# Patient Record
Sex: Male | Born: 1960 | Race: White | Hispanic: No | Smoking: Current every day smoker
Health system: Southern US, Community
[De-identification: ages and names within clinical notes are randomized; demographics above are authoritative.]

## PROBLEM LIST (undated history)

## (undated) ENCOUNTER — Emergency Department (HOSPITAL_COMMUNITY): Discharge status: Absent without leave | Payer: Self-pay

## (undated) DIAGNOSIS — E119 Type 2 diabetes mellitus without complications: Secondary | ICD-10-CM

## (undated) DIAGNOSIS — I10 Essential (primary) hypertension: Secondary | ICD-10-CM

---

## 2006-01-30 ENCOUNTER — Ambulatory Visit: Payer: Self-pay

## 2006-03-12 ENCOUNTER — Other Ambulatory Visit: Payer: Self-pay

## 2006-03-19 ENCOUNTER — Ambulatory Visit: Payer: Self-pay | Admitting: Specialist

## 2010-05-22 ENCOUNTER — Emergency Department: Payer: Self-pay | Admitting: Unknown Physician Specialty

## 2010-05-24 DIAGNOSIS — S129XXA Fracture of neck, unspecified, initial encounter: Secondary | ICD-10-CM | POA: Insufficient documentation

## 2010-06-28 DIAGNOSIS — I10 Essential (primary) hypertension: Secondary | ICD-10-CM | POA: Insufficient documentation

## 2010-09-05 DIAGNOSIS — M67919 Unspecified disorder of synovium and tendon, unspecified shoulder: Secondary | ICD-10-CM | POA: Insufficient documentation

## 2010-11-07 DIAGNOSIS — M25519 Pain in unspecified shoulder: Secondary | ICD-10-CM | POA: Insufficient documentation

## 2011-08-06 DIAGNOSIS — M8448XA Pathological fracture, other site, initial encounter for fracture: Secondary | ICD-10-CM | POA: Insufficient documentation

## 2011-08-06 DIAGNOSIS — M51369 Other intervertebral disc degeneration, lumbar region without mention of lumbar back pain or lower extremity pain: Secondary | ICD-10-CM | POA: Insufficient documentation

## 2011-12-03 DIAGNOSIS — G894 Chronic pain syndrome: Secondary | ICD-10-CM | POA: Insufficient documentation

## 2015-12-29 ENCOUNTER — Emergency Department (HOSPITAL_COMMUNITY)
Admission: EM | Admit: 2015-12-29 | Discharge: 2015-12-30 | Discharge status: Against Medical Advice | Payer: Self-pay | Attending: Emergency Medicine | Admitting: Emergency Medicine

## 2015-12-29 ENCOUNTER — Emergency Department (HOSPITAL_COMMUNITY): Payer: Self-pay

## 2015-12-29 ENCOUNTER — Encounter (HOSPITAL_COMMUNITY): Payer: Self-pay | Admitting: Emergency Medicine

## 2015-12-29 DIAGNOSIS — S50311A Abrasion of right elbow, initial encounter: Secondary | ICD-10-CM | POA: Insufficient documentation

## 2015-12-29 DIAGNOSIS — Y999 Unspecified external cause status: Secondary | ICD-10-CM | POA: Insufficient documentation

## 2015-12-29 DIAGNOSIS — F10920 Alcohol use, unspecified with intoxication, uncomplicated: Secondary | ICD-10-CM

## 2015-12-29 DIAGNOSIS — I10 Essential (primary) hypertension: Secondary | ICD-10-CM | POA: Insufficient documentation

## 2015-12-29 DIAGNOSIS — Z5181 Encounter for therapeutic drug level monitoring: Secondary | ICD-10-CM | POA: Insufficient documentation

## 2015-12-29 DIAGNOSIS — Y929 Unspecified place or not applicable: Secondary | ICD-10-CM | POA: Insufficient documentation

## 2015-12-29 DIAGNOSIS — S0031XA Abrasion of nose, initial encounter: Secondary | ICD-10-CM | POA: Insufficient documentation

## 2015-12-29 DIAGNOSIS — W010XXA Fall on same level from slipping, tripping and stumbling without subsequent striking against object, initial encounter: Secondary | ICD-10-CM | POA: Insufficient documentation

## 2015-12-29 DIAGNOSIS — Y939 Activity, unspecified: Secondary | ICD-10-CM | POA: Insufficient documentation

## 2015-12-29 DIAGNOSIS — T148XXA Other injury of unspecified body region, initial encounter: Secondary | ICD-10-CM

## 2015-12-29 DIAGNOSIS — S40212A Abrasion of left shoulder, initial encounter: Secondary | ICD-10-CM | POA: Insufficient documentation

## 2015-12-29 DIAGNOSIS — F10120 Alcohol abuse with intoxication, uncomplicated: Secondary | ICD-10-CM | POA: Insufficient documentation

## 2015-12-29 DIAGNOSIS — F17200 Nicotine dependence, unspecified, uncomplicated: Secondary | ICD-10-CM | POA: Insufficient documentation

## 2015-12-29 DIAGNOSIS — E119 Type 2 diabetes mellitus without complications: Secondary | ICD-10-CM | POA: Insufficient documentation

## 2015-12-29 HISTORY — DX: Essential (primary) hypertension: I10

## 2015-12-29 HISTORY — DX: Type 2 diabetes mellitus without complications: E11.9

## 2015-12-29 MED ORDER — TETANUS-DIPHTH-ACELL PERTUSSIS 5-2.5-18.5 LF-MCG/0.5 IM SUSP
0.5000 mL | Freq: Once | INTRAMUSCULAR | Status: DC
  Filled 2015-12-29: qty 0.5

## 2015-12-29 NOTE — ED Notes (Signed)
Pt has multiple scratches on his R elbow, bilateral knees, nose and L cheek.

## 2015-12-29 NOTE — ED Notes (Signed)
[place] EMS[place], pt c/o intoxication and SI. Pt uses alcohol to help with his depression. Pt sts he has had 2 1/2 40 oz bottles. A&Ox4, unsteady on his feet.

## 2015-12-29 NOTE — ED Notes (Signed)
Bed: [facility] Expected date:  Expected time:  Means of arrival:  Comments: No bed 

## 2015-12-29 NOTE — ED Provider Notes (Signed)
CSN: [id number]     Arrival date & time 12/29/15  2255 History  By signing my name below, I, Vista Minkobert Ross, attest that this documentation has been prepared under the direction and in the presence of Gilda Creasehristopher J Pollina, MD. Electronically signed, Vista Minkobert Ross, ED Scribe. 12/29/2015. 11:52 PM.    Chief Complaint  Patient presents with  . Alcohol Intoxication  . Suicidal   The history is provided by the patient and the EMS personnel. No language interpreter was used.   HPI Comments: Robyn HaberDavid W Owen is a 55 y.o. male brought in by ambulance, who presents to the Emergency Department for alcohol intoxication less than one hour ago. Pt is alert and oriented currently. Per EMS, pt stated he had SI but currently denies ever saying that. Pt has abrasions on his right elbow, nose left cheek, and left shoulder. Pt reports he was unaware of his injuries but later states that he may have slipped somewhere. Pt states he lives in [place] with his mother. Pt denies any SI/HI.  Past Medical History  Diagnosis Date  . Diabetes mellitus without complication (HCC)   . Hypertension    No past surgical history on file. No family history on file. Social History  Substance Use Topics  . Smoking status: Not on file  . Smokeless tobacco: Not on file  . Alcohol Use: Yes    Review of Systems  Skin: Positive for wound (right elbow, left knee, nose, left cheek, left shoulder).  Psychiatric/Behavioral: Negative for suicidal ideas.  All other systems reviewed and are negative.   Allergies  Review of patient's allergies indicates no known allergies.  Home Medications   Prior to Admission medications   Not on File   BP 151/111 mmHg  Pulse 88  Temp(Src) 99 F (37.2 C) (Oral)  Resp 20  SpO2 95% Physical Exam  Constitutional: He is oriented to person, place, and time. He appears well-developed and well-nourished. No distress.  HENT:  Head: Normocephalic and atraumatic.  Right Ear: Hearing normal.   Left Ear: Hearing normal.  Nose: Nose normal.  Mouth/Throat: Oropharynx is clear and moist and mucous membranes are normal.  Eyes: Conjunctivae and EOM are normal. Pupils are equal, round, and reactive to light.  Neck: Normal range of motion. Neck supple.  Cardiovascular: Regular rhythm, S1 normal and S2 normal.  Exam reveals no gallop and no friction rub.   No murmur heard. Pulmonary/Chest: Effort normal and breath sounds normal. No respiratory distress. He exhibits no tenderness.  Abdominal: Soft. Normal appearance and bowel sounds are normal. There is no hepatosplenomegaly. There is no tenderness. There is no rebound, no guarding, no tenderness at McBurney's point and negative Murphy's sign. No hernia.  Musculoskeletal: Normal range of motion.  Swelling and abrasion with no deformity.   Neurological: He is alert and oriented to person, place, and time. He has normal strength. No cranial nerve deficit or sensory deficit. Coordination normal. GCS eye subscore is 4. GCS verbal subscore is 5. GCS motor subscore is 6.  Skin: Skin is warm, dry and intact. No rash noted. No cyanosis.  Psychiatric: Thought content normal. His speech is slurred.  Nursing note and vitals reviewed.   ED Course  Procedures  DIAGNOSTIC STUDIES: Oxygen Saturation is 95% on RA, normal by my interpretation.  COORDINATION OF CARE: 11:16 PM-Will be further evaluated for any injuries. Discussed treatment plan with pt at bedside and pt agreed to plan.   Labs Review Labs Reviewed - No data to display  Imaging Review No results found. I have personally reviewed and evaluated these images and lab results as part of my medical decision-making.   EKG Interpretation None      MDM   Final diagnoses:  None  Alcohol intoxication Abrasion  Patient presents to the emergency department for evaluation after possible fall. Patient is actively intoxicated. He does have a history of alcohol abuse. Patient is unsure how  he received his abrasions, thinks he might have tripped. He is without complaints. He reportedly mention to staff earlier that he was suicidal, however, he adamantly denied it to me on multiple occasions. Workup was initiated to rule out significant injury from his fall, although he is alert and oriented without any clinical signs of significant injury, he was intoxicated. Patient did, however, elope from the ER during workup.  I personally performed the services described in this documentation, which was scribed in my presence. The recorded information has been reviewed and is accurate.      Gilda Creasehristopher J Pollina, MD 12/30/15 [place] [email or web address]

## 2015-12-30 ENCOUNTER — Emergency Department (HOSPITAL_COMMUNITY): Payer: Self-pay

## 2015-12-30 ENCOUNTER — Ambulatory Visit (HOSPITAL_COMMUNITY): Admission: RE | Admit: 2015-12-30 | Payer: Self-pay | Source: Ambulatory Visit

## 2015-12-30 ENCOUNTER — Ambulatory Visit (HOSPITAL_COMMUNITY): Payer: Self-pay

## 2015-12-30 ENCOUNTER — Inpatient Hospital Stay (HOSPITAL_COMMUNITY)
Admission: EM | Admit: 2015-12-30 | Discharge: 2015-12-31 | DRG: 087 | Disposition: A | Discharge status: Home / Self Care | Payer: Self-pay | Attending: Surgery | Admitting: Surgery

## 2015-12-30 ENCOUNTER — Encounter (HOSPITAL_COMMUNITY): Payer: Self-pay

## 2015-12-30 DIAGNOSIS — G4733 Obstructive sleep apnea (adult) (pediatric): Secondary | ICD-10-CM | POA: Diagnosis present

## 2015-12-30 DIAGNOSIS — I609 Nontraumatic subarachnoid hemorrhage, unspecified: Secondary | ICD-10-CM

## 2015-12-30 DIAGNOSIS — S0285XA Fracture of orbit, unspecified, initial encounter for closed fracture: Secondary | ICD-10-CM

## 2015-12-30 DIAGNOSIS — T148XXA Other injury of unspecified body region, initial encounter: Secondary | ICD-10-CM

## 2015-12-30 DIAGNOSIS — S066X0A Traumatic subarachnoid hemorrhage without loss of consciousness, initial encounter: Principal | ICD-10-CM | POA: Diagnosis present

## 2015-12-30 DIAGNOSIS — I10 Essential (primary) hypertension: Secondary | ICD-10-CM | POA: Diagnosis present

## 2015-12-30 DIAGNOSIS — E041 Nontoxic single thyroid nodule: Secondary | ICD-10-CM | POA: Diagnosis present

## 2015-12-30 DIAGNOSIS — W138XXA Fall from, out of or through other building or structure, initial encounter: Secondary | ICD-10-CM | POA: Diagnosis present

## 2015-12-30 DIAGNOSIS — Y9302 Activity, running: Secondary | ICD-10-CM | POA: Diagnosis present

## 2015-12-30 DIAGNOSIS — Y92481 Parking lot as the place of occurrence of the external cause: Secondary | ICD-10-CM

## 2015-12-30 DIAGNOSIS — S0231XA Fracture of orbital floor, right side, initial encounter for closed fracture: Secondary | ICD-10-CM | POA: Diagnosis present

## 2015-12-30 DIAGNOSIS — F17200 Nicotine dependence, unspecified, uncomplicated: Secondary | ICD-10-CM | POA: Diagnosis present

## 2015-12-30 DIAGNOSIS — F10129 Alcohol abuse with intoxication, unspecified: Secondary | ICD-10-CM | POA: Diagnosis present

## 2015-12-30 DIAGNOSIS — S022XXB Fracture of nasal bones, initial encounter for open fracture: Secondary | ICD-10-CM | POA: Diagnosis present

## 2015-12-30 DIAGNOSIS — S80212A Abrasion, left knee, initial encounter: Secondary | ICD-10-CM | POA: Diagnosis present

## 2015-12-30 DIAGNOSIS — S301XXA Contusion of abdominal wall, initial encounter: Secondary | ICD-10-CM

## 2015-12-30 DIAGNOSIS — E119 Type 2 diabetes mellitus without complications: Secondary | ICD-10-CM | POA: Diagnosis present

## 2015-12-30 DIAGNOSIS — Z23 Encounter for immunization: Secondary | ICD-10-CM

## 2015-12-30 LAB — URINALYSIS, ROUTINE W REFLEX MICROSCOPIC
BILIRUBIN URINE: NEGATIVE
Glucose, UA: 500 mg/dL — AB
Ketones, ur: NEGATIVE mg/dL
Leukocytes, UA: NEGATIVE
Nitrite: NEGATIVE
PH: 5 (ref 5.0–8.0)
Protein, ur: 30 mg/dL — AB
SPECIFIC GRAVITY, URINE: 1.008 (ref 1.005–1.030)

## 2015-12-30 LAB — GLUCOSE, CAPILLARY
Glucose-Capillary: 132 mg/dL — ABNORMAL HIGH (ref 65–99)
Glucose-Capillary: 118 mg/dL — ABNORMAL HIGH (ref 65–99)

## 2015-12-30 LAB — CBC WITH DIFFERENTIAL/PLATELET
BASOS PCT: 1 %
Basophils Absolute: 0.1 10*3/uL (ref 0.0–0.1)
EOS ABS: 0 10*3/uL (ref 0.0–0.7)
Eosinophils Relative: 0 %
HCT: 40 % (ref 39.0–52.0)
HEMOGLOBIN: 13.8 g/dL (ref 13.0–17.0)
Lymphocytes Relative: 14 %
Lymphs Abs: 2.2 10*3/uL (ref 0.7–4.0)
MCH: 31.8 pg (ref 26.0–34.0)
MCHC: 34.5 g/dL (ref 30.0–36.0)
MCV: 92.2 fL (ref 78.0–100.0)
MONO ABS: 1.6 10*3/uL — AB (ref 0.1–1.0)
MONOS PCT: 10 %
NEUTROS PCT: 75 %
Neutro Abs: 12.5 10*3/uL — ABNORMAL HIGH (ref 1.7–7.7)
PLATELETS: 273 10*3/uL (ref 150–400)
RBC: 4.34 MIL/uL (ref 4.22–5.81)
RDW: 12.7 % (ref 11.5–15.5)
WBC: 16.5 10*3/uL — ABNORMAL HIGH (ref 4.0–10.5)

## 2015-12-30 LAB — CBC
HCT: 46 % (ref 39.0–52.0)
HEMATOCRIT: 38.9 % — AB (ref 39.0–52.0)
Hemoglobin: 13.7 g/dL (ref 13.0–17.0)
Hemoglobin: 15.9 g/dL (ref 13.0–17.0)
MCH: 31.7 pg (ref 26.0–34.0)
MCH: 31.9 pg (ref 26.0–34.0)
MCHC: 34.6 g/dL (ref 30.0–36.0)
MCHC: 35.2 g/dL (ref 30.0–36.0)
MCV: 90.5 fL (ref 78.0–100.0)
MCV: 91.8 fL (ref 78.0–100.0)
PLATELETS: 246 10*3/uL (ref 150–400)
PLATELETS: 298 10*3/uL (ref 150–400)
RBC: 4.3 MIL/uL (ref 4.22–5.81)
RBC: 5.01 MIL/uL (ref 4.22–5.81)
RDW: 12.9 % (ref 11.5–15.5)
RDW: 12.9 % (ref 11.5–15.5)
WBC: 14.6 10*3/uL — AB (ref 4.0–10.5)
WBC: 16.1 10*3/uL — AB (ref 4.0–10.5)

## 2015-12-30 LAB — RAPID URINE DRUG SCREEN, HOSP PERFORMED
AMPHETAMINES: NOT DETECTED
AMPHETAMINES: NOT DETECTED
BENZODIAZEPINES: NOT DETECTED
Barbiturates: NOT DETECTED
Barbiturates: NOT DETECTED
Benzodiazepines: NOT DETECTED
Cocaine: NOT DETECTED
Cocaine: NOT DETECTED
OPIATES: NOT DETECTED
Opiates: NOT DETECTED
TETRAHYDROCANNABINOL: NOT DETECTED
TETRAHYDROCANNABINOL: NOT DETECTED

## 2015-12-30 LAB — COMPREHENSIVE METABOLIC PANEL
ALBUMIN: 3.7 g/dL (ref 3.5–5.0)
ALBUMIN: 4.5 g/dL (ref 3.5–5.0)
ALT: 32 U/L (ref 17–63)
ALT: 34 U/L (ref 17–63)
ANION GAP: 11 (ref 5–15)
AST: 53 U/L — AB (ref 15–41)
AST: 75 U/L — ABNORMAL HIGH (ref 15–41)
Alkaline Phosphatase: 67 U/L (ref 38–126)
Alkaline Phosphatase: 73 U/L (ref 38–126)
Anion gap: 10 (ref 5–15)
BUN: 7 mg/dL (ref 6–20)
BUN: 8 mg/dL (ref 6–20)
CHLORIDE: 100 mmol/L — AB (ref 101–111)
CHLORIDE: 100 mmol/L — AB (ref 101–111)
CO2: 19 mmol/L — AB (ref 22–32)
CO2: 21 mmol/L — AB (ref 22–32)
CREATININE: 0.66 mg/dL (ref 0.61–1.24)
CREATININE: 0.71 mg/dL (ref 0.61–1.24)
Calcium: 8.9 mg/dL (ref 8.9–10.3)
Calcium: 9.8 mg/dL (ref 8.9–10.3)
GFR calc Af Amer: >60 mL/min (ref >60)
GFR calc non Af Amer: >60 mL/min (ref >60)
GFR calc non Af Amer: >60 mL/min (ref >60)
GLUCOSE: 135 mg/dL — AB (ref 65–99)
Glucose, Bld: 154 mg/dL — ABNORMAL HIGH (ref 65–99)
POTASSIUM: 3.2 mmol/L — AB (ref 3.5–5.1)
Potassium: 3.8 mmol/L (ref 3.5–5.1)
SODIUM: 130 mmol/L — AB (ref 135–145)
SODIUM: 131 mmol/L — AB (ref 135–145)
Total Bilirubin: 0.8 mg/dL (ref 0.3–1.2)
Total Bilirubin: 0.8 mg/dL (ref 0.3–1.2)
Total Protein: 6.3 g/dL — ABNORMAL LOW (ref 6.5–8.1)
Total Protein: 7.6 g/dL (ref 6.5–8.1)

## 2015-12-30 LAB — URINE MICROSCOPIC-ADD ON
Bacteria, UA: NONE SEEN
RBC / HPF: NONE SEEN RBC/hpf (ref 0–5)
SQUAMOUS EPITHELIAL / LPF: NONE SEEN
WBC, UA: NONE SEEN WBC/hpf (ref 0–5)

## 2015-12-30 LAB — BASIC METABOLIC PANEL
ANION GAP: 10 (ref 5–15)
BUN: 9 mg/dL (ref 6–20)
CALCIUM: 8.7 mg/dL — AB (ref 8.9–10.3)
CO2: 20 mmol/L — AB (ref 22–32)
Chloride: 101 mmol/L (ref 101–111)
Creatinine, Ser: 0.71 mg/dL (ref 0.61–1.24)
GLUCOSE: 155 mg/dL — AB (ref 65–99)
POTASSIUM: 4 mmol/L (ref 3.5–5.1)
Sodium: 131 mmol/L — ABNORMAL LOW (ref 135–145)

## 2015-12-30 LAB — PROTIME-INR
INR: 1.16 (ref 0.00–1.49)
Prothrombin Time: 15 seconds (ref 11.6–15.2)

## 2015-12-30 LAB — ETHANOL: Alcohol, Ethyl (B): 301 mg/dL (ref <5)

## 2015-12-30 LAB — CBG MONITORING, ED: Glucose-Capillary: 150 mg/dL — ABNORMAL HIGH (ref 65–99)

## 2015-12-30 MED ORDER — HALOPERIDOL LACTATE 5 MG/ML IJ SOLN
5.0000 mg | Freq: Once | INTRAMUSCULAR | Status: AC
  Administered 2015-12-30: 5 mg via INTRAVENOUS
  Filled 2015-12-30: qty 1

## 2015-12-30 MED ORDER — HALOPERIDOL LACTATE 5 MG/ML IJ SOLN
5.0000 mg | Freq: Once | INTRAMUSCULAR | Status: AC
Start: 2015-12-30 — End: 2015-12-30
  Administered 2015-12-30: 5 mg via INTRAVENOUS
  Filled 2015-12-30: qty 1

## 2015-12-30 MED ORDER — VITAMIN B-1 100 MG PO TABS
100.0000 mg | ORAL_TABLET | Freq: Every day | ORAL | Status: DC
  Administered 2015-12-31: 100 mg via ORAL
  Filled 2015-12-30: qty 1

## 2015-12-30 MED ORDER — LORAZEPAM 1 MG PO TABS: 0.0000 mg | ORAL_TABLET | Freq: Four times a day (QID) | ORAL | Status: DC

## 2015-12-30 MED ORDER — MORPHINE SULFATE (PF) 2 MG/ML IV SOLN: 1.0000 mg | INTRAVENOUS | Status: DC | PRN

## 2015-12-30 MED ORDER — KCL IN DEXTROSE-NACL 20-5-0.45 MEQ/L-%-% IV SOLN
INTRAVENOUS | Status: DC
  Administered 2015-12-30 – 2015-12-31 (×2): via INTRAVENOUS
  Filled 2015-12-30 (×4): qty 1000

## 2015-12-30 MED ORDER — LORAZEPAM 1 MG PO TABS: 0.0000 mg | ORAL_TABLET | Freq: Two times a day (BID) | ORAL | Status: DC

## 2015-12-30 MED ORDER — LORAZEPAM 2 MG/ML IJ SOLN: 1.0000 mg | Freq: Four times a day (QID) | INTRAMUSCULAR | Status: DC | PRN

## 2015-12-30 MED ORDER — FAMOTIDINE 20 MG PO TABS
40.0000 mg | ORAL_TABLET | Freq: Every day | ORAL | Status: DC
  Administered 2015-12-31: 40 mg via ORAL
  Filled 2015-12-30: qty 2

## 2015-12-30 MED ORDER — ADULT MULTIVITAMIN W/MINERALS CH
1.0000 | ORAL_TABLET | Freq: Every day | ORAL | Status: DC
  Administered 2015-12-30 – 2015-12-31 (×2): 1 via ORAL
  Filled 2015-12-30 (×2): qty 1

## 2015-12-30 MED ORDER — IOPAMIDOL (ISOVUE-300) INJECTION 61%
100.0000 mL | Freq: Once | INTRAVENOUS | Status: AC | PRN
  Administered 2015-12-30: 100 mL via INTRAVENOUS

## 2015-12-30 MED ORDER — ONDANSETRON HCL 4 MG/2ML IJ SOLN: 4.0000 mg | INTRAMUSCULAR | Status: DC | PRN

## 2015-12-30 MED ORDER — FAMOTIDINE IN NACL 20-0.9 MG/50ML-% IV SOLN
20.0000 mg | Freq: Two times a day (BID) | INTRAVENOUS | Status: DC
  Administered 2015-12-30: 20 mg via INTRAVENOUS
  Filled 2015-12-30: qty 50

## 2015-12-30 MED ORDER — CEFAZOLIN IN D5W 1 GM/50ML IV SOLN
1.0000 g | Freq: Three times a day (TID) | INTRAVENOUS | Status: DC
  Administered 2015-12-30 – 2015-12-31 (×4): 1 g via INTRAVENOUS
  Filled 2015-12-30 (×7): qty 50

## 2015-12-30 MED ORDER — PANTOPRAZOLE SODIUM 40 MG PO TBEC
40.0000 mg | DELAYED_RELEASE_TABLET | Freq: Two times a day (BID) | ORAL | Status: DC
  Administered 2015-12-30 – 2015-12-31 (×2): 40 mg via ORAL
  Filled 2015-12-30 (×2): qty 1

## 2015-12-30 MED ORDER — ONDANSETRON HCL 4 MG PO TABS: 4.0000 mg | ORAL_TABLET | Freq: Four times a day (QID) | ORAL | Status: DC | PRN

## 2015-12-30 MED ORDER — LIDOCAINE HCL (PF) 1 % IJ SOLN
30.0000 mL | Freq: Once | INTRAMUSCULAR | Status: AC
  Administered 2015-12-30: 30 mL
  Filled 2015-12-30: qty 30

## 2015-12-30 MED ORDER — FOLIC ACID 1 MG PO TABS
1.0000 mg | ORAL_TABLET | Freq: Every day | ORAL | Status: DC
Start: 2015-12-30 — End: 2015-12-31
  Administered 2015-12-30 – 2015-12-31 (×2): 1 mg via ORAL
  Filled 2015-12-30 (×2): qty 1

## 2015-12-30 MED ORDER — LORAZEPAM 1 MG PO TABS: 1.0000 mg | ORAL_TABLET | Freq: Four times a day (QID) | ORAL | Status: DC | PRN

## 2015-12-30 MED ORDER — CEFAZOLIN IN D5W 1 GM/50ML IV SOLN
1.0000 g | Freq: Once | INTRAVENOUS | Status: AC
  Administered 2015-12-30: 1 g via INTRAVENOUS
  Filled 2015-12-30: qty 50

## 2015-12-30 MED ORDER — TETANUS-DIPHTH-ACELL PERTUSSIS 5-2.5-18.5 LF-MCG/0.5 IM SUSP
0.5000 mL | Freq: Once | INTRAMUSCULAR | Status: AC
  Administered 2015-12-30: 0.5 mL via INTRAMUSCULAR
  Filled 2015-12-30: qty 0.5

## 2015-12-30 MED ORDER — BACITRACIN-NEOMYCIN-POLYMYXIN OINTMENT TUBE
TOPICAL_OINTMENT | Freq: Every day | CUTANEOUS | Status: DC
  Administered 2015-12-30: 1 via TOPICAL
  Administered 2015-12-31: 09:00:00 via TOPICAL
  Filled 2015-12-30: qty 15

## 2015-12-30 MED ORDER — THIAMINE HCL 100 MG/ML IJ SOLN
100.0000 mg | Freq: Every day | INTRAMUSCULAR | Status: DC
  Administered 2015-12-30: 100 mg via INTRAVENOUS
  Filled 2015-12-30: qty 2

## 2015-12-30 MED ORDER — DOCUSATE SODIUM 100 MG PO CAPS: 100.0000 mg | ORAL_CAPSULE | Freq: Every day | ORAL | Status: DC
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
## 2015-12-30 NOTE — ED Notes (Signed)
Nose continues to bleed, Combat Gauze now applied
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
## 2015-12-30 NOTE — Progress Notes (Signed)
Patient is awake and alert.  Not showing signs of withdrawal.  Wants to eat.  Head CT minimal SAH.  Will transfer to the floor.  Marta LamasJames O. Gae BonWyatt, III, MD, FACS [phone] Trauma Surgeon
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
## 2015-12-30 NOTE — ED Provider Notes (Addendum)
CSN: [id number]     Arrival date & time 12/30/15  0112 History  By signing my name below, I, Jeffrey Solomon, attest that this documentation has been prepared under the direction and in the presence of Jeffrey Crease, MD. Electronically signed, Jeffrey Solomon, ED Scribe. 12/30/2015. 1:37 AM.   Chief Complaint  Patient presents with  . Facial Injury   The history is provided by the patient and the EMS personnel. No language interpreter was used.   HPI Comments: Jeffrey Solomon is a 55 y.o. male brought in by ambulance, who presents to the Emergency Department for a facial injury s/p an incident that occurred less 30 minutes ago. Pt was seen in the [facility] ED less than one hour ago for alcohol intoxication. Pt left ED AMA, exiting into the parking lot where he tried to jump a concrete wall and fell. Pt busted his nose, bleeding is controlled.    Past Medical History  Diagnosis Date  . Diabetes mellitus without complication (HCC)   . Hypertension    History reviewed. No pertinent past surgical history. History reviewed. No pertinent family history. Social History  Substance Use Topics  . Smoking status: Current Every Day Smoker  . Smokeless tobacco: None  . Alcohol Use: Yes    Review of Systems  Constitutional: Negative for fever.  Skin: Positive for wound (nose).      Allergies  Review of patient's allergies indicates no known allergies.  Home Medications   Prior to Admission medications   Not on File   There were no vitals taken for this visit. Physical Exam  Constitutional: He is oriented to person, place, and time. He appears well-developed and well-nourished. No distress.  HENT:  Head: Normocephalic.  Right Ear: Hearing normal.  Left Ear: Hearing normal.  Nose: Nose normal.  Mouth/Throat: Oropharynx is clear and moist and mucous membranes are normal.  Linear laceration along entire length of nose with exposed nasal passage.  Abrasion and swelling left zygomatic region   Eyes: Conjunctivae and EOM are normal. Pupils are equal, round, and reactive to light.  Neck: Normal range of motion. Neck supple.  Cardiovascular: Regular rhythm, S1 normal and S2 normal.  Exam reveals no gallop and no friction rub.   No murmur heard. Pulmonary/Chest: Effort normal and breath sounds normal. No respiratory distress. He exhibits no tenderness.  Abdominal: Soft. Normal appearance and bowel sounds are normal. There is no hepatosplenomegaly. There is no tenderness. There is no rebound, no guarding, no tenderness at McBurney's point and negative Murphy's sign. No hernia.  Musculoskeletal: Normal range of motion.  Neurological: He is alert and oriented to person, place, and time. He has normal strength. No cranial nerve deficit or sensory deficit. Coordination normal. GCS eye subscore is 4. GCS verbal subscore is 5. GCS motor subscore is 6.  Skin: Skin is warm, dry and intact. No rash noted. No cyanosis.  Abrasions to left face, left shoulder, leftt knee, right elbow  Psychiatric: He has a normal mood and affect. His speech is normal and behavior is normal. Thought content normal.  Nursing note and vitals reviewed.        ED Course  Procedures   LACERATION REPAIR Performed by: Jeffrey Solomon. Authorized by: Jeffrey Solomon Consent: Verbal consent obtained. Risks and benefits: risks, benefits and alternatives were discussed Consent given by: patient Patient identity confirmed: provided demographic data Prepped and Draped in normal sterile fashion Wound explored  Laceration Location: face Laceration Length: 5cm Several pieces  of vegetation in wound, removed with forceps and extensive irrigation - no residual seen Anesthesia: local infiltration Local anesthetic: lidocaine 1% without epinephrine Anesthetic total: 4 ml Irrigation method: syringe Amount of cleaning: extensive Skin closure: sutures Number of sutures: 10 Technique: simple interrupted,  5-0 Prolene  Patient tolerance: Patient tolerated the procedure well with no immediate complications.   DIAGNOSTIC STUDIES: Oxygen Saturation is 95% on RA, normal by my interpretation.  COORDINATION OF CARE: 1:30 AM-Will order blood work and imaging. Discussed treatment plan with pt at bedside and pt agreed to plan.   Labs Review Labs Reviewed  CBC WITH DIFFERENTIAL/PLATELET - Abnormal; Notable for the following:    WBC 16.5 (*)    Neutro Abs 12.5 (*)    Monocytes Absolute 1.6 (*)    All other components within normal limits  COMPREHENSIVE METABOLIC PANEL - Abnormal; Notable for the following:    Sodium 130 (*)    Potassium 3.2 (*)    Chloride 100 (*)    CO2 19 (*)    Glucose, Bld 135 (*)    Total Protein 6.3 (*)    AST 75 (*)    All other components within normal limits  URINALYSIS, ROUTINE W REFLEX MICROSCOPIC (NOT AT [facility]) - Abnormal; Notable for the following:    Glucose, UA 500 (*)    Hgb urine dipstick LARGE (*)    Protein, ur 30 (*)    All other components within normal limits  PROTIME-INR  URINE RAPID DRUG SCREEN, HOSP PERFORMED  URINE MICROSCOPIC-ADD ON    Imaging Review Ct Head Wo Contrast  12/30/2015  CLINICAL DATA:  Injuries to left head and left shoulder of unknown etiology, and subsequently ran out of ER, went over a concrete wall and fell onto ground. Concern for cervical spine injury. Initial encounter. EXAM: CT HEAD WITHOUT CONTRAST CT CERVICAL SPINE WITHOUT CONTRAST TECHNIQUE: Multidetector CT imaging of the head and cervical spine was performed following the standard protocol without intravenous contrast. Multiplanar CT image reconstructions of the cervical spine were also generated. COMPARISON:  None. FINDINGS: CT HEAD FINDINGS There appears to be trace subarachnoid hemorrhage overlying the central right frontal lobe. There is no additional evidence of intra or extra-axial hemorrhage. No mass lesion or focal infarct is seen. Prominence of the sulci suggests  mild cortical volume loss. Mild cerebellar atrophy is noted. Mild subcortical white matter change likely reflects small vessel ischemic microangiopathy. The brainstem and fourth ventricle are within normal limits. The basal ganglia are unremarkable in appearance. The cerebral hemispheres demonstrate grossly normal gray-white differentiation. No mass effect or midline shift is seen. There is a significantly comminuted fracture of the nasal bone, with overlying soft tissue swelling and disruption. In addition, there appears to be a fracture through the right orbital floor, extending along the anterior wall of the right maxillary sinus, without evidence of herniation of intraorbital fat. Blood is noted partially filling the right maxillary sinus. No intraorbital air is seen; no intraorbital hemorrhage is identified. The left orbit is unremarkable in appearance. The remaining paranasal sinuses and mastoid air cells are well-aerated. Mild soft tissue swelling is noted at the right occiput, and lateral to the left orbit. Soft tissue swelling extends over the left zygomatic arch. CT CERVICAL SPINE FINDINGS There is no evidence of fracture or subluxation. Vertebral bodies demonstrate normal height. Mild multilevel disc space narrowing is noted along the cervical spine, with scattered anterior and posterior disc osteophyte complexes. There is mild grade 1 anterolisthesis of C6 on C7.  There is chronic osseous fusion of the facets on the right side at C3-C4. A 1.2 cm hypodensity with minimal calcification at the right thyroid lobe is likely benign, given its size. The visualized lung apices are clear. No significant soft tissue abnormalities are seen. IMPRESSION: 1. Trace subarachnoid hemorrhage overlying the central right frontal lobe. 2. Fracture through the right orbital floor, extending along the anterior wall of the right maxillary sinus, without evidence of herniation of intraorbital fat. Blood partially fills the right  maxillary sinus. The right orbit is otherwise unremarkable in appearance. 3. Significantly comminuted fracture of the nasal bone, with overlying soft tissue swelling and disruption. 4. No evidence of fracture or subluxation along the cervical spine. 5. Mild soft tissue swelling at the right occiput, and lateral to the left orbit. Soft tissue swelling extends overlying the left zygomatic arch. 6. Mild degenerative change along the cervical spine. 7. Mild cortical volume loss and scattered small vessel ischemic microangiopathy. Critical Value/emergent results were called by telephone at the time of interpretation on 12/30/2015 at 3:07 am to Dr. Jaci Carrel, who verbally acknowledged these results. Electronically Signed   By: Roanna Raider M.D.   On: 12/30/2015 03:08   Ct Chest W Contrast  12/30/2015  CLINICAL DATA:  Trauma, fall. Patient was in ED earlier with injuries to head and shoulder, he did not know what caused the injuries. Ran out of ED, followed to parking deck, went over a concrete wall onto ground, injury to nose. EXAM: CT CHEST, ABDOMEN, AND PELVIS WITH CONTRAST TECHNIQUE: Multidetector CT imaging of the chest, abdomen and pelvis was performed following the standard protocol during bolus administration of intravenous contrast. CONTRAST:  ISOVUE-300 IOPAMIDOL (ISOVUE-300) INJECTION 61% COMPARISON:  None. FINDINGS: CT CHEST FINDINGS No acute traumatic aortic injury. No mediastinal hematoma. No pleural or pericardial effusion. There is elevation of the left hemidiaphragm. Breathing motion artifact limits assessment. No pulmonary contusion. No pneumothorax or pneumomediastinum. The sternum is intact. Remote right rib fractures. No acute rib fracture. Included clavicle and shoulder girdles intact. Age-indeterminate mild compression deformity superior endplate of T5, suspect this is chronic. No soft tissue stranding of the chest wall. CT ABDOMEN AND PELVIS FINDINGS Breathing motion artifact  through the upper abdomen. No acute traumatic injury to the liver, gallbladder, spleen, pancreas, kidneys, or adrenal glands. Gallbladder is distended, question dependent gallstones versus motion. Bilateral renal cysts, largest in the left upper pole. The stomach is distended with ingested contents. There are no dilated or thickened bowel loops. There are prominent mesenteric lymph nodes adjacent to the descending colon. Additional prominent nodes scattered throughout the mesenteric. The adjacent colon is decompressed without gross evidence of focal mass. No mesenteric hematoma. No free air, free fluid, or intra-abdominal fluid collection. No retroperitoneal fluid. The IVC appears intact. No retroperitoneal adenopathy. Abdominal aorta is normal in caliber. Within the pelvis the bladder is physiologically distended without wall thickening. No free fluid in the pelvis. Soft tissue stranding and rounded density in the left flank consistent with subcutaneous contusion. No active extravasation. Fat in the left inguinal canal is incidentally noted. Bony pelvis is intact without fracture. Lumbar spine is intact without fracture. IMPRESSION: 1. Subcutaneous soft tissue contusion to the left flank. 2. Age indeterminate compression deformity of T5, however suspect this is chronic. 3. No traumatic intra-thoracic or abdominal injury. Additional nontraumatic findings as described. Electronically Signed   By: Rubye Oaks M.D.   On: 12/30/2015 03:01   Ct Cervical Spine Wo Contrast  12/30/2015  CLINICAL DATA:  Injuries to left head and left shoulder of unknown etiology, and subsequently ran out of ER, went over a concrete wall and fell onto ground. Concern for cervical spine injury. Initial encounter. EXAM: CT HEAD WITHOUT CONTRAST CT CERVICAL SPINE WITHOUT CONTRAST TECHNIQUE: Multidetector CT imaging of the head and cervical spine was performed following the standard protocol without intravenous contrast. Multiplanar CT  image reconstructions of the cervical spine were also generated. COMPARISON:  None. FINDINGS: CT HEAD FINDINGS There appears to be trace subarachnoid hemorrhage overlying the central right frontal lobe. There is no additional evidence of intra or extra-axial hemorrhage. No mass lesion or focal infarct is seen. Prominence of the sulci suggests mild cortical volume loss. Mild cerebellar atrophy is noted. Mild subcortical white matter change likely reflects small vessel ischemic microangiopathy. The brainstem and fourth ventricle are within normal limits. The basal ganglia are unremarkable in appearance. The cerebral hemispheres demonstrate grossly normal gray-white differentiation. No mass effect or midline shift is seen. There is a significantly comminuted fracture of the nasal bone, with overlying soft tissue swelling and disruption. In addition, there appears to be a fracture through the right orbital floor, extending along the anterior wall of the right maxillary sinus, without evidence of herniation of intraorbital fat. Blood is noted partially filling the right maxillary sinus. No intraorbital air is seen; no intraorbital hemorrhage is identified. The left orbit is unremarkable in appearance. The remaining paranasal sinuses and mastoid air cells are well-aerated. Mild soft tissue swelling is noted at the right occiput, and lateral to the left orbit. Soft tissue swelling extends over the left zygomatic arch. CT CERVICAL SPINE FINDINGS There is no evidence of fracture or subluxation. Vertebral bodies demonstrate normal height. Mild multilevel disc space narrowing is noted along the cervical spine, with scattered anterior and posterior disc osteophyte complexes. There is mild grade 1 anterolisthesis of C6 on C7. There is chronic osseous fusion of the facets on the right side at C3-C4. A 1.2 cm hypodensity with minimal calcification at the right thyroid lobe is likely benign, given its size. The visualized lung  apices are clear. No significant soft tissue abnormalities are seen. IMPRESSION: 1. Trace subarachnoid hemorrhage overlying the central right frontal lobe. 2. Fracture through the right orbital floor, extending along the anterior wall of the right maxillary sinus, without evidence of herniation of intraorbital fat. Blood partially fills the right maxillary sinus. The right orbit is otherwise unremarkable in appearance. 3. Significantly comminuted fracture of the nasal bone, with overlying soft tissue swelling and disruption. 4. No evidence of fracture or subluxation along the cervical spine. 5. Mild soft tissue swelling at the right occiput, and lateral to the left orbit. Soft tissue swelling extends overlying the left zygomatic arch. 6. Mild degenerative change along the cervical spine. 7. Mild cortical volume loss and scattered small vessel ischemic microangiopathy. Critical Value/emergent results were called by telephone at the time of interpretation on 12/30/2015 at 3:07 am to Dr. Jaci CarrelHRISTOPHER POLLINA, who verbally acknowledged these results. Electronically Signed   By: Roanna RaiderJeffery  Chang M.D.   On: 12/30/2015 03:08   Ct Abdomen Pelvis W Contrast  12/30/2015  CLINICAL DATA:  Trauma, fall. Patient was in ED earlier with injuries to head and shoulder, he did not know what caused the injuries. Ran out of ED, followed to parking deck, went over a concrete wall onto ground, injury to nose. EXAM: CT CHEST, ABDOMEN, AND PELVIS WITH CONTRAST TECHNIQUE: Multidetector CT imaging of the chest, abdomen and pelvis  was performed following the standard protocol during bolus administration of intravenous contrast. CONTRAST:  ISOVUE-300 IOPAMIDOL (ISOVUE-300) INJECTION 61% COMPARISON:  None. FINDINGS: CT CHEST FINDINGS No acute traumatic aortic injury. No mediastinal hematoma. No pleural or pericardial effusion. There is elevation of the left hemidiaphragm. Breathing motion artifact limits assessment. No pulmonary contusion. No  pneumothorax or pneumomediastinum. The sternum is intact. Remote right rib fractures. No acute rib fracture. Included clavicle and shoulder girdles intact. Age-indeterminate mild compression deformity superior endplate of T5, suspect this is chronic. No soft tissue stranding of the chest wall. CT ABDOMEN AND PELVIS FINDINGS Breathing motion artifact through the upper abdomen. No acute traumatic injury to the liver, gallbladder, spleen, pancreas, kidneys, or adrenal glands. Gallbladder is distended, question dependent gallstones versus motion. Bilateral renal cysts, largest in the left upper pole. The stomach is distended with ingested contents. There are no dilated or thickened bowel loops. There are prominent mesenteric lymph nodes adjacent to the descending colon. Additional prominent nodes scattered throughout the mesenteric. The adjacent colon is decompressed without gross evidence of focal mass. No mesenteric hematoma. No free air, free fluid, or intra-abdominal fluid collection. No retroperitoneal fluid. The IVC appears intact. No retroperitoneal adenopathy. Abdominal aorta is normal in caliber. Within the pelvis the bladder is physiologically distended without wall thickening. No free fluid in the pelvis. Soft tissue stranding and rounded density in the left flank consistent with subcutaneous contusion. No active extravasation. Fat in the left inguinal canal is incidentally noted. Bony pelvis is intact without fracture. Lumbar spine is intact without fracture. IMPRESSION: 1. Subcutaneous soft tissue contusion to the left flank. 2. Age indeterminate compression deformity of T5, however suspect this is chronic. 3. No traumatic intra-thoracic or abdominal injury. Additional nontraumatic findings as described. Electronically Signed   By: Rubye Oaks M.D.   On: 12/30/2015 03:01   Dg Knee Complete 4 Views Left  12/30/2015  CLINICAL DATA:  Fall tonight, left knee pain. EXAM: LEFT KNEE - COMPLETE 4+ VIEW  COMPARISON:  None. FINDINGS: No fracture or dislocation. Mild patellofemoral joint space narrowing and spurring. Small peripheral spurs in the medial compartment with preservation of joint space. No joint effusion. IMPRESSION: No fracture or dislocation of the left knee. Electronically Signed   By: Rubye Oaks M.D.   On: 12/30/2015 00:48   I have personally reviewed and evaluated these images and lab results as part of my medical decision-making.   EKG Interpretation   Date/Time:  Friday December 30 2015 01:44:37 EDT Ventricular Rate:  109 PR Interval:    QRS Duration: 93 QT Interval:  326 QTC Calculation: 439 R Axis:   69 Text Interpretation:  Sinus tachycardia Probable left atrial enlargement  Confirmed by POLLINA  MD, CHRISTOPHER [phone]) on 12/30/2015 1:53:13 AM      MDM   Final diagnoses:  SAH (subarachnoid hemorrhage) (HCC)  Orbital fracture, closed, initial encounter (HCC)  Nasal fracture, open, initial encounter  Contusion, flank, initial encounter  Abrasion   Patient was seen in the ER earlier tonight for alcohol intoxication and apparent fall. Patient was brought in by ambulance with abrasions on his torso and face, was unclear exactly what happened. During his workup, patient eloped from the emergency department. He apparently ran across the parking lot here and jumped off of a wall. It's not clear how far he fell, the EMS report that he was found at the bottom of a wall that was 12 feet high. Upon being brought back into the emergency department patient  was noted to have a large laceration across the length of his nose.  Patient obviously intoxicated and somewhat agitated at arrival. He was to start Haldol to sedate him to facilitate workup.  Patient underwent extensive radiologic evaluation. Patient had CT head, CT cervical spine, CT chest, CT abdomen and pelvis. Patient found to have comminuted nasal fracture and orbital floor fracture. No extraocular muscle entrapment.  Dr. Cherly Hensenhang, radiology does not feel that he requires any further images of the face, such as maxillofacial CT, as he feels that he did see this area well. There is no sign of trauma below the nasal area.   CT head reveals evidence of subarachnoid hemorrhage overlying the central right frontal lobe. Patient is obviously intoxicated but does not have any obvious neurologic deficit from this.  X-ray of left knee from previous visit was negative.  Patient discussed with Dr. Abbey Chattersosenbower, on-call for general surgery. He will come and evaluate the patient, [facility] and admission to the trauma service.  CRITICAL CARE Performed by: Jeffrey CreasePOLLINA, CHRISTOPHER J.   Total critical care time: 35 minutes  Critical care time was exclusive of separately billable procedures and treating other patients.  Critical care was necessary to treat or prevent imminent or life-threatening deterioration.  Critical care was time spent personally by me on the following activities: development of treatment plan with patient and/or surrogate as well as nursing, discussions with consultants, evaluation of patient's response to treatment, examination of patient, obtaining history from patient or surrogate, ordering and performing treatments and interventions, ordering and review of laboratory studies, ordering and review of radiographic studies, pulse oximetry and re-evaluation of patient's condition.   I personally performed the services described in this documentation, which was scribed in my presence. The recorded information has been reviewed and is accurate.    Jeffrey Creasehristopher J Pollina, MD 12/30/15 [id number]  Jeffrey Creasehristopher J Pollina, MD 12/30/15 [phone]

## 2015-12-30 NOTE — Progress Notes (Signed)
Pt arrived to 5C13 via wheelchair.  Alert and oriented X4.  VSS.  C/o pain 5/10 headache.  Will continue to monitor.  Sondra ComeSilva, Andrei Mccook M, RN

## 2015-12-30 NOTE — ED Notes (Signed)
[facility], update given to [place], RCharity fundraiser

## 2015-12-30 NOTE — ED Notes (Addendum)
Pt cleaned up and pressure held to nose

## 2015-12-30 NOTE — Consult Note (Signed)
Reason for Consult: Closed head injury Referring Physician: Trauma M.D.  Jeffrey Solomon is an 55 y.o. male.   HPI:  55 year old male who states he fell last night. He suffered a laceration to his nose. Has some mild headache. No visual changes. No numbness tingling or weakness. No neck pain. Head CT showed minimal subarachnoid hemorrhage in the right frontal region and neurosurgical evaluation was requested.  Past Medical History  Diagnosis Date  . Diabetes mellitus without complication (HCC)   . Hypertension     History reviewed. No pertinent past surgical history.  No Known Allergies  Social History  Substance Use Topics  . Smoking status: Current Every Day Smoker  . Smokeless tobacco: Not on file  . Alcohol Use: Yes    History reviewed. No pertinent family history.   Review of Systems  Positive ROS: neg  All other systems have been reviewed and were otherwise negative with the exception of those mentioned in the HPI and as above.  Objective: Vital signs in last 24 hours: Temp:  [97.8 F (36.6 C)-99 F (37.2 C)] 97.8 F (36.6 C) (07/07 0840) Pulse Rate:  [83-88] 83 (07/07 0900) Resp:  [18-28] 28 (07/07 0900) BP: (123-151)/(79-111) 123/79 mmHg (07/07 0900) SpO2:  [94 %-100 %] 98 % (07/07 0900) Weight:  [90.4 kg (199 lb 4.7 oz)] 90.4 kg (199 lb 4.7 oz) (07/07 0840)  General Appearance: Alert, cooperative, no distress, appears stated age, bilateral raccoon eyes and sutured laceration to nose Head: Normocephalic, without obvious abnormality, atraumatic Eyes: PERRL, conjunctiva/corneas clear, EOM's intact     Neck: Supple, symmetrical, trachea midline Back: Symmetric, no curvature, ROM normal, no CVA tenderness Lungs: respirations unlabored Heart: Regular rate and rhythm   NEUROLOGIC:   Mental status: A&O x4, no aphasia, good attention span, Memory and fund of knowledge appear to be appropriate Motor Exam - grossly normal, normal tone and bulk Sensory Exam -  grossly normal Reflexes: symmetric, no pathologic reflexes Coordination - grossly normal Gait - not tested Balance - not tested Cranial Nerves: I: smell Not tested  II: visual acuity  OS: na    OD: na  II: visual fields Full to confrontation  II: pupils Equal, round, reactive to light  III,VII: ptosis None  III,IV,VI: extraocular muscles  Full ROM  V: mastication Normal  V: facial light touch sensation  Normal  V,VII: corneal reflex  Present  VII: facial muscle function - upper  Normal  VII: facial muscle function - lower Normal  VIII: hearing Not tested  IX: soft palate elevation  Normal  IX,X: gag reflex Present  XI: trapezius strength  5/5  XI: sternocleidomastoid strength 5/5  XI: neck flexion strength  5/5  XII: tongue strength  Normal    Data Review Lab Results  Component Value Date   WBC 16.1* 12/30/2015   HGB 13.7 12/30/2015   HCT 38.9* 12/30/2015   MCV 90.5 12/30/2015   PLT 246 12/30/2015   Lab Results  Component Value Date   NA 131* 12/30/2015   K 4.0 12/30/2015   CL 101 12/30/2015   CO2 20* 12/30/2015   BUN 9 12/30/2015   CREATININE 0.71 12/30/2015   GLUCOSE 155* 12/30/2015   Lab Results  Component Value Date   INR 1.16 12/30/2015    Radiology: Ct Head Wo Contrast  12/30/2015  CLINICAL DATA:  Injuries to left head and left shoulder of unknown etiology, and subsequently ran out of ER, went over a concrete wall and fell onto ground. Concern  for cervical spine injury. Initial encounter. EXAM: CT HEAD WITHOUT CONTRAST CT CERVICAL SPINE WITHOUT CONTRAST TECHNIQUE: Multidetector CT imaging of the head and cervical spine was performed following the standard protocol without intravenous contrast. Multiplanar CT image reconstructions of the cervical spine were also generated. COMPARISON:  None. FINDINGS: CT HEAD FINDINGS There appears to be trace subarachnoid hemorrhage overlying the central right frontal lobe. There is no additional evidence of intra or  extra-axial hemorrhage. No mass lesion or focal infarct is seen. Prominence of the sulci suggests mild cortical volume loss. Mild cerebellar atrophy is noted. Mild subcortical white matter change likely reflects small vessel ischemic microangiopathy. The brainstem and fourth ventricle are within normal limits. The basal ganglia are unremarkable in appearance. The cerebral hemispheres demonstrate grossly normal gray-white differentiation. No mass effect or midline shift is seen. There is a significantly comminuted fracture of the nasal bone, with overlying soft tissue swelling and disruption. In addition, there appears to be a fracture through the right orbital floor, extending along the anterior wall of the right maxillary sinus, without evidence of herniation of intraorbital fat. Blood is noted partially filling the right maxillary sinus. No intraorbital air is seen; no intraorbital hemorrhage is identified. The left orbit is unremarkable in appearance. The remaining paranasal sinuses and mastoid air cells are well-aerated. Mild soft tissue swelling is noted at the right occiput, and lateral to the left orbit. Soft tissue swelling extends over the left zygomatic arch. CT CERVICAL SPINE FINDINGS There is no evidence of fracture or subluxation. Vertebral bodies demonstrate normal height. Mild multilevel disc space narrowing is noted along the cervical spine, with scattered anterior and posterior disc osteophyte complexes. There is mild grade 1 anterolisthesis of C6 on C7. There is chronic osseous fusion of the facets on the right side at C3-C4. A 1.2 cm hypodensity with minimal calcification at the right thyroid lobe is likely benign, given its size. The visualized lung apices are clear. No significant soft tissue abnormalities are seen. IMPRESSION: 1. Trace subarachnoid hemorrhage overlying the central right frontal lobe. 2. Fracture through the right orbital floor, extending along the anterior wall of the right  maxillary sinus, without evidence of herniation of intraorbital fat. Blood partially fills the right maxillary sinus. The right orbit is otherwise unremarkable in appearance. 3. Significantly comminuted fracture of the nasal bone, with overlying soft tissue swelling and disruption. 4. No evidence of fracture or subluxation along the cervical spine. 5. Mild soft tissue swelling at the right occiput, and lateral to the left orbit. Soft tissue swelling extends overlying the left zygomatic arch. 6. Mild degenerative change along the cervical spine. 7. Mild cortical volume loss and scattered small vessel ischemic microangiopathy. Critical Value/emergent results were called by telephone at the time of interpretation on 12/30/2015 at 3:07 am to Dr. Jaci Carrel, who verbally acknowledged these results. Electronically Signed   By: Roanna Raider M.D.   On: 12/30/2015 03:08   Ct Chest W Contrast  12/30/2015  CLINICAL DATA:  Trauma, fall. Patient was in ED earlier with injuries to head and shoulder, he did not know what caused the injuries. Ran out of ED, followed to parking deck, went over a concrete wall onto ground, injury to nose. EXAM: CT CHEST, ABDOMEN, AND PELVIS WITH CONTRAST TECHNIQUE: Multidetector CT imaging of the chest, abdomen and pelvis was performed following the standard protocol during bolus administration of intravenous contrast. CONTRAST:  ISOVUE-300 IOPAMIDOL (ISOVUE-300) INJECTION 61% COMPARISON:  None. FINDINGS: CT CHEST FINDINGS No acute traumatic  aortic injury. No mediastinal hematoma. No pleural or pericardial effusion. There is elevation of the left hemidiaphragm. Breathing motion artifact limits assessment. No pulmonary contusion. No pneumothorax or pneumomediastinum. The sternum is intact. Remote right rib fractures. No acute rib fracture. Included clavicle and shoulder girdles intact. Age-indeterminate mild compression deformity superior endplate of T5, suspect this is chronic. No  soft tissue stranding of the chest wall. CT ABDOMEN AND PELVIS FINDINGS Breathing motion artifact through the upper abdomen. No acute traumatic injury to the liver, gallbladder, spleen, pancreas, kidneys, or adrenal glands. Gallbladder is distended, question dependent gallstones versus motion. Bilateral renal cysts, largest in the left upper pole. The stomach is distended with ingested contents. There are no dilated or thickened bowel loops. There are prominent mesenteric lymph nodes adjacent to the descending colon. Additional prominent nodes scattered throughout the mesenteric. The adjacent colon is decompressed without gross evidence of focal mass. No mesenteric hematoma. No free air, free fluid, or intra-abdominal fluid collection. No retroperitoneal fluid. The IVC appears intact. No retroperitoneal adenopathy. Abdominal aorta is normal in caliber. Within the pelvis the bladder is physiologically distended without wall thickening. No free fluid in the pelvis. Soft tissue stranding and rounded density in the left flank consistent with subcutaneous contusion. No active extravasation. Fat in the left inguinal canal is incidentally noted. Bony pelvis is intact without fracture. Lumbar spine is intact without fracture. IMPRESSION: 1. Subcutaneous soft tissue contusion to the left flank. 2. Age indeterminate compression deformity of T5, however suspect this is chronic. 3. No traumatic intra-thoracic or abdominal injury. Additional nontraumatic findings as described. Electronically Signed   By: Rubye Oaks M.D.   On: 12/30/2015 03:01   Ct Cervical Spine Wo Contrast  12/30/2015  CLINICAL DATA:  Injuries to left head and left shoulder of unknown etiology, and subsequently ran out of ER, went over a concrete wall and fell onto ground. Concern for cervical spine injury. Initial encounter. EXAM: CT HEAD WITHOUT CONTRAST CT CERVICAL SPINE WITHOUT CONTRAST TECHNIQUE: Multidetector CT imaging of the head and cervical  spine was performed following the standard protocol without intravenous contrast. Multiplanar CT image reconstructions of the cervical spine were also generated. COMPARISON:  None. FINDINGS: CT HEAD FINDINGS There appears to be trace subarachnoid hemorrhage overlying the central right frontal lobe. There is no additional evidence of intra or extra-axial hemorrhage. No mass lesion or focal infarct is seen. Prominence of the sulci suggests mild cortical volume loss. Mild cerebellar atrophy is noted. Mild subcortical white matter change likely reflects small vessel ischemic microangiopathy. The brainstem and fourth ventricle are within normal limits. The basal ganglia are unremarkable in appearance. The cerebral hemispheres demonstrate grossly normal gray-white differentiation. No mass effect or midline shift is seen. There is a significantly comminuted fracture of the nasal bone, with overlying soft tissue swelling and disruption. In addition, there appears to be a fracture through the right orbital floor, extending along the anterior wall of the right maxillary sinus, without evidence of herniation of intraorbital fat. Blood is noted partially filling the right maxillary sinus. No intraorbital air is seen; no intraorbital hemorrhage is identified. The left orbit is unremarkable in appearance. The remaining paranasal sinuses and mastoid air cells are well-aerated. Mild soft tissue swelling is noted at the right occiput, and lateral to the left orbit. Soft tissue swelling extends over the left zygomatic arch. CT CERVICAL SPINE FINDINGS There is no evidence of fracture or subluxation. Vertebral bodies demonstrate normal height. Mild multilevel disc space narrowing is noted along  the cervical spine, with scattered anterior and posterior disc osteophyte complexes. There is mild grade 1 anterolisthesis of C6 on C7. There is chronic osseous fusion of the facets on the right side at C3-C4. A 1.2 cm hypodensity with minimal  calcification at the right thyroid lobe is likely benign, given its size. The visualized lung apices are clear. No significant soft tissue abnormalities are seen. IMPRESSION: 1. Trace subarachnoid hemorrhage overlying the central right frontal lobe. 2. Fracture through the right orbital floor, extending along the anterior wall of the right maxillary sinus, without evidence of herniation of intraorbital fat. Blood partially fills the right maxillary sinus. The right orbit is otherwise unremarkable in appearance. 3. Significantly comminuted fracture of the nasal bone, with overlying soft tissue swelling and disruption. 4. No evidence of fracture or subluxation along the cervical spine. 5. Mild soft tissue swelling at the right occiput, and lateral to the left orbit. Soft tissue swelling extends overlying the left zygomatic arch. 6. Mild degenerative change along the cervical spine. 7. Mild cortical volume loss and scattered small vessel ischemic microangiopathy. Critical Value/emergent results were called by telephone at the time of interpretation on 12/30/2015 at 3:07 am to Dr. Jaci Carrel, who verbally acknowledged these results. Electronically Signed   By: Roanna Raider M.D.   On: 12/30/2015 03:08   Ct Abdomen Pelvis W Contrast  12/30/2015  CLINICAL DATA:  Trauma, fall. Patient was in ED earlier with injuries to head and shoulder, he did not know what caused the injuries. Ran out of ED, followed to parking deck, went over a concrete wall onto ground, injury to nose. EXAM: CT CHEST, ABDOMEN, AND PELVIS WITH CONTRAST TECHNIQUE: Multidetector CT imaging of the chest, abdomen and pelvis was performed following the standard protocol during bolus administration of intravenous contrast. CONTRAST:  ISOVUE-300 IOPAMIDOL (ISOVUE-300) INJECTION 61% COMPARISON:  None. FINDINGS: CT CHEST FINDINGS No acute traumatic aortic injury. No mediastinal hematoma. No pleural or pericardial effusion. There is elevation of  the left hemidiaphragm. Breathing motion artifact limits assessment. No pulmonary contusion. No pneumothorax or pneumomediastinum. The sternum is intact. Remote right rib fractures. No acute rib fracture. Included clavicle and shoulder girdles intact. Age-indeterminate mild compression deformity superior endplate of T5, suspect this is chronic. No soft tissue stranding of the chest wall. CT ABDOMEN AND PELVIS FINDINGS Breathing motion artifact through the upper abdomen. No acute traumatic injury to the liver, gallbladder, spleen, pancreas, kidneys, or adrenal glands. Gallbladder is distended, question dependent gallstones versus motion. Bilateral renal cysts, largest in the left upper pole. The stomach is distended with ingested contents. There are no dilated or thickened bowel loops. There are prominent mesenteric lymph nodes adjacent to the descending colon. Additional prominent nodes scattered throughout the mesenteric. The adjacent colon is decompressed without gross evidence of focal mass. No mesenteric hematoma. No free air, free fluid, or intra-abdominal fluid collection. No retroperitoneal fluid. The IVC appears intact. No retroperitoneal adenopathy. Abdominal aorta is normal in caliber. Within the pelvis the bladder is physiologically distended without wall thickening. No free fluid in the pelvis. Soft tissue stranding and rounded density in the left flank consistent with subcutaneous contusion. No active extravasation. Fat in the left inguinal canal is incidentally noted. Bony pelvis is intact without fracture. Lumbar spine is intact without fracture. IMPRESSION: 1. Subcutaneous soft tissue contusion to the left flank. 2. Age indeterminate compression deformity of T5, however suspect this is chronic. 3. No traumatic intra-thoracic or abdominal injury. Additional nontraumatic findings as described. Electronically Signed  By: Rubye OaksMelanie  Ehinger M.D.   On: 12/30/2015 03:01   Dg Knee Complete 4 Views  Left  12/30/2015  CLINICAL DATA:  Fall tonight, left knee pain. EXAM: LEFT KNEE - COMPLETE 4+ VIEW COMPARISON:  None. FINDINGS: No fracture or dislocation. Mild patellofemoral joint space narrowing and spurring. Small peripheral spurs in the medial compartment with preservation of joint space. No joint effusion. IMPRESSION: No fracture or dislocation of the left knee. Electronically Signed   By: Rubye OaksMelanie  Ehinger M.D.   On: 12/30/2015 00:48     Assessment/Plan: Closed head injury with a minimal subarachnoid hemorrhage and right frontal region without mass effect or shift. This does not require further imaging unless the patient has some change in status.   Joushua Dugar,Fredrico S 12/30/2015 10:52 AM

## 2015-12-30 NOTE — ED Notes (Signed)
Bed: [id number] Expected date:  Expected time:  Means of arrival:  Comments: ETOH/ fall

## 2015-12-30 NOTE — ED Notes (Signed)
Pt walked out of the ED and into our parking deck where he tried to jump over the wall into the grass.

## 2015-12-30 NOTE — ED Notes (Signed)
Patient transported to CT 

## 2015-12-30 NOTE — ED Notes (Signed)
[facility] called for transport. 

## 2015-12-30 NOTE — Consult Note (Signed)
Reason for Consult: facial trauma Referring Physician: Dr. Jackolyn Confer  Jeffrey Solomon is an 55 y.o. male.  HPI: The patient is a 55 yrs old wm in the ICU for evaluation.  He was originally brought to the ED last night due to intoxication and being found down according to the notes.  The patient states he does not remember what happened before the ED or after last night.  He left the ED and sustained a fall on his face and was brought back.  A CT of the face was done and viewed.  Full report is listed below.  He sustained a nasal/septal and right orbital floor fracture. He had a laceration of the skin on the nose which was repaired in the ED.  He has significant swelling, redness and bruising of his face and around the periorbital area.  His oxygen saturation is normal at present but is having difficulty breathing from the right nostril.   He is being evaluated for a subarachnoid hematoma.  Past Medical History  Diagnosis Date  . Diabetes mellitus without complication ([place])   . Hypertension     History reviewed. No pertinent past surgical history.  History reviewed. No pertinent family history.  Social History:  reports that he has been smoking.  He does not have any smokeless tobacco history on file. He reports that he drinks alcohol. His drug history is not on file.  Allergies: No Known Allergies  Medications: I have reviewed the patient's current medications.  Results for orders placed or performed during the hospital encounter of 12/30/15 (from the past 48 hour(s))  Urine rapid drug screen (hosp performed)     Status: None   Collection Time: 12/30/15  2:24 AM  Result Value Ref Range   Opiates NONE DETECTED NONE DETECTED   Cocaine NONE DETECTED NONE DETECTED   Benzodiazepines NONE DETECTED NONE DETECTED   Amphetamines NONE DETECTED NONE DETECTED   Tetrahydrocannabinol NONE DETECTED NONE DETECTED   Barbiturates NONE DETECTED NONE DETECTED    Comment:        DRUG SCREEN FOR  MEDICAL PURPOSES ONLY.  IF CONFIRMATION IS NEEDED FOR ANY PURPOSE, NOTIFY LAB WITHIN 5 DAYS.        LOWEST DETECTABLE LIMITS FOR URINE DRUG SCREEN Drug Class       Cutoff (ng/mL) Amphetamine      1000 Barbiturate      200 Benzodiazepine   397 Tricyclics       673 Opiates          300 Cocaine          300 THC              50   Urinalysis, Routine w reflex microscopic (not at [facility])     Status: Abnormal   Collection Time: 12/30/15  2:24 AM  Result Value Ref Range   Color, Urine YELLOW YELLOW   APPearance CLEAR CLEAR   Specific Gravity, Urine 1.008 1.005 - 1.030   pH 5.0 5.0 - 8.0   Glucose, UA 500 (A) NEGATIVE mg/dL   Hgb urine dipstick LARGE (A) NEGATIVE   Bilirubin Urine NEGATIVE NEGATIVE   Ketones, ur NEGATIVE NEGATIVE mg/dL   Protein, ur 30 (A) NEGATIVE mg/dL   Nitrite NEGATIVE NEGATIVE   Leukocytes, UA NEGATIVE NEGATIVE  Urine microscopic-add on     Status: None   Collection Time: 12/30/15  2:24 AM  Result Value Ref Range   Squamous Epithelial / LPF NONE SEEN NONE SEEN  WBC, UA NONE SEEN 0 - 5 WBC/hpf   RBC / HPF NONE SEEN 0 - 5 RBC/hpf   Bacteria, UA NONE SEEN NONE SEEN  CBC with Differential/Platelet     Status: Abnormal   Collection Time: 12/30/15  2:38 AM  Result Value Ref Range   WBC 16.5 (H) 4.0 - 10.5 K/uL   RBC 4.34 4.22 - 5.81 MIL/uL   Hemoglobin 13.8 13.0 - 17.0 g/dL   HCT 40.0 39.0 - 52.0 %   MCV 92.2 78.0 - 100.0 fL   MCH 31.8 26.0 - 34.0 pg   MCHC 34.5 30.0 - 36.0 g/dL   RDW 12.7 11.5 - 15.5 %   Platelets 273 150 - 400 K/uL   Neutrophils Relative % 75 %   Neutro Abs 12.5 (H) 1.7 - 7.7 K/uL   Lymphocytes Relative 14 %   Lymphs Abs 2.2 0.7 - 4.0 K/uL   Monocytes Relative 10 %   Monocytes Absolute 1.6 (H) 0.1 - 1.0 K/uL   Eosinophils Relative 0 %   Eosinophils Absolute 0.0 0.0 - 0.7 K/uL   Basophils Relative 1 %   Basophils Absolute 0.1 0.0 - 0.1 K/uL  Comprehensive metabolic panel     Status: Abnormal   Collection Time: 12/30/15  2:38 AM    Result Value Ref Range   Sodium 130 (L) 135 - 145 mmol/L   Potassium 3.2 (L) 3.5 - 5.1 mmol/L   Chloride 100 (L) 101 - 111 mmol/L   CO2 19 (L) 22 - 32 mmol/L   Glucose, Bld 135 (H) 65 - 99 mg/dL   BUN 7 6 - 20 mg/dL   Creatinine, Ser 0.66 0.61 - 1.24 mg/dL   Calcium 8.9 8.9 - 10.3 mg/dL   Total Protein 6.3 (L) 6.5 - 8.1 g/dL   Albumin 3.7 3.5 - 5.0 g/dL   AST 75 (H) 15 - 41 U/L   ALT 34 17 - 63 U/L   Alkaline Phosphatase 67 38 - 126 U/L   Total Bilirubin 0.8 0.3 - 1.2 mg/dL   GFR calc non Af Amer >60 >60 mL/min   GFR calc Af Amer >60 >60 mL/min    Comment: (NOTE) The eGFR has been calculated using the CKD EPI equation. This calculation has not been validated in all clinical situations. eGFR's persistently <60 mL/min signify possible Chronic Kidney Disease.    Anion gap 11 5 - 15  Protime-INR     Status: None   Collection Time: 12/30/15  2:38 AM  Result Value Ref Range   Prothrombin Time 15.0 11.6 - 15.2 seconds   INR 1.16 0.00 - 1.49  CBC     Status: Abnormal   Collection Time: 12/30/15  5:36 AM  Result Value Ref Range   WBC 16.1 (H) 4.0 - 10.5 K/uL   RBC 4.30 4.22 - 5.81 MIL/uL   Hemoglobin 13.7 13.0 - 17.0 g/dL   HCT 38.9 (L) 39.0 - 52.0 %   MCV 90.5 78.0 - 100.0 fL   MCH 31.9 26.0 - 34.0 pg   MCHC 35.2 30.0 - 36.0 g/dL   RDW 12.9 11.5 - 15.5 %   Platelets 246 150 - 400 K/uL  Basic metabolic panel     Status: Abnormal   Collection Time: 12/30/15  5:36 AM  Result Value Ref Range   Sodium 131 (L) 135 - 145 mmol/L   Potassium 4.0 3.5 - 5.1 mmol/L    Comment: RESULT REPEATED AND VERIFIED DELTA CHECK NOTED NO VISIBLE HEMOLYSIS  Chloride 101 101 - 111 mmol/L   CO2 20 (L) 22 - 32 mmol/L   Glucose, Bld 155 (H) 65 - 99 mg/dL   BUN 9 6 - 20 mg/dL   Creatinine, Ser 0.71 0.61 - 1.24 mg/dL   Calcium 8.7 (L) 8.9 - 10.3 mg/dL   GFR calc non Af Amer >60 >60 mL/min   GFR calc Af Amer >60 >60 mL/min    Comment: (NOTE) The eGFR has been calculated using the CKD EPI  equation. This calculation has not been validated in all clinical situations. eGFR's persistently <60 mL/min signify possible Chronic Kidney Disease.    Anion gap 10 5 - 15  CBG monitoring, ED     Status: Abnormal   Collection Time: 12/30/15  5:38 AM  Result Value Ref Range   Glucose-Capillary 150 (H) 65 - 99 mg/dL  Glucose, capillary     Status: Abnormal   Collection Time: 12/30/15 11:32 AM  Result Value Ref Range   Glucose-Capillary 132 (H) 65 - 99 mg/dL  Glucose, capillary     Status: Abnormal   Collection Time: 12/30/15  6:01 PM  Result Value Ref Range   Glucose-Capillary 118 (H) 65 - 99 mg/dL    Ct Head Wo Contrast  12/30/2015  CLINICAL DATA:  Injuries to left head and left shoulder of unknown etiology, and subsequently ran out of ER, went over a concrete wall and fell onto ground. Concern for cervical spine injury. Initial encounter. EXAM: CT HEAD WITHOUT CONTRAST CT CERVICAL SPINE WITHOUT CONTRAST TECHNIQUE: Multidetector CT imaging of the head and cervical spine was performed following the standard protocol without intravenous contrast. Multiplanar CT image reconstructions of the cervical spine were also generated. COMPARISON:  None. FINDINGS: CT HEAD FINDINGS There appears to be trace subarachnoid hemorrhage overlying the central right frontal lobe. There is no additional evidence of intra or extra-axial hemorrhage. No mass lesion or focal infarct is seen. Prominence of the sulci suggests mild cortical volume loss. Mild cerebellar atrophy is noted. Mild subcortical white matter change likely reflects small vessel ischemic microangiopathy. The brainstem and fourth ventricle are within normal limits. The basal ganglia are unremarkable in appearance. The cerebral hemispheres demonstrate grossly normal gray-white differentiation. No mass effect or midline shift is seen. There is a significantly comminuted fracture of the nasal bone, with overlying soft tissue swelling and disruption. In  addition, there appears to be a fracture through the right orbital floor, extending along the anterior wall of the right maxillary sinus, without evidence of herniation of intraorbital fat. Blood is noted partially filling the right maxillary sinus. No intraorbital air is seen; no intraorbital hemorrhage is identified. The left orbit is unremarkable in appearance. The remaining paranasal sinuses and mastoid air cells are well-aerated. Mild soft tissue swelling is noted at the right occiput, and lateral to the left orbit. Soft tissue swelling extends over the left zygomatic arch. CT CERVICAL SPINE FINDINGS There is no evidence of fracture or subluxation. Vertebral bodies demonstrate normal height. Mild multilevel disc space narrowing is noted along the cervical spine, with scattered anterior and posterior disc osteophyte complexes. There is mild grade 1 anterolisthesis of C6 on C7. There is chronic osseous fusion of the facets on the right side at C3-C4. A 1.2 cm hypodensity with minimal calcification at the right thyroid lobe is likely benign, given its size. The visualized lung apices are clear. No significant soft tissue abnormalities are seen. IMPRESSION: 1. Trace subarachnoid hemorrhage overlying the central right frontal lobe. 2. Fracture  through the right orbital floor, extending along the anterior wall of the right maxillary sinus, without evidence of herniation of intraorbital fat. Blood partially fills the right maxillary sinus. The right orbit is otherwise unremarkable in appearance. 3. Significantly comminuted fracture of the nasal bone, with overlying soft tissue swelling and disruption. 4. No evidence of fracture or subluxation along the cervical spine. 5. Mild soft tissue swelling at the right occiput, and lateral to the left orbit. Soft tissue swelling extends overlying the left zygomatic arch. 6. Mild degenerative change along the cervical spine. 7. Mild cortical volume loss and scattered small  vessel ischemic microangiopathy. Critical Value/emergent results were called by telephone at the time of interpretation on 12/30/2015 at 3:07 am to Dr. Joseph Berkshire, who verbally acknowledged these results. Electronically Signed   By: Garald Balding M.D.   On: 12/30/2015 03:08   Ct Chest W Contrast  12/30/2015  CLINICAL DATA:  Trauma, fall. Patient was in ED earlier with injuries to head and shoulder, he did not know what caused the injuries. Ran out of ED, followed to parking deck, went over a concrete wall onto ground, injury to nose. EXAM: CT CHEST, ABDOMEN, AND PELVIS WITH CONTRAST TECHNIQUE: Multidetector CT imaging of the chest, abdomen and pelvis was performed following the standard protocol during bolus administration of intravenous contrast. CONTRAST:  133m ISOVUE-300 IOPAMIDOL (ISOVUE-300) INJECTION 61% COMPARISON:  None. FINDINGS: CT CHEST FINDINGS No acute traumatic aortic injury. No mediastinal hematoma. No pleural or pericardial effusion. There is elevation of the left hemidiaphragm. Breathing motion artifact limits assessment. No pulmonary contusion. No pneumothorax or pneumomediastinum. The sternum is intact. Remote right rib fractures. No acute rib fracture. Included clavicle and shoulder girdles intact. Age-indeterminate mild compression deformity superior endplate of T5, suspect this is chronic. No soft tissue stranding of the chest wall. CT ABDOMEN AND PELVIS FINDINGS Breathing motion artifact through the upper abdomen. No acute traumatic injury to the liver, gallbladder, spleen, pancreas, kidneys, or adrenal glands. Gallbladder is distended, question dependent gallstones versus motion. Bilateral renal cysts, largest in the left upper pole. The stomach is distended with ingested contents. There are no dilated or thickened bowel loops. There are prominent mesenteric lymph nodes adjacent to the descending colon. Additional prominent nodes scattered throughout the mesenteric. The adjacent  colon is decompressed without gross evidence of focal mass. No mesenteric hematoma. No free air, free fluid, or intra-abdominal fluid collection. No retroperitoneal fluid. The IVC appears intact. No retroperitoneal adenopathy. Abdominal aorta is normal in caliber. Within the pelvis the bladder is physiologically distended without wall thickening. No free fluid in the pelvis. Soft tissue stranding and rounded density in the left flank consistent with subcutaneous contusion. No active extravasation. Fat in the left inguinal canal is incidentally noted. Bony pelvis is intact without fracture. Lumbar spine is intact without fracture. IMPRESSION: 1. Subcutaneous soft tissue contusion to the left flank. 2. Age indeterminate compression deformity of T5, however suspect this is chronic. 3. No traumatic intra-thoracic or abdominal injury. Additional nontraumatic findings as described. Electronically Signed   By: MJeb LeveringM.D.   On: 12/30/2015 03:01   Ct Cervical Spine Wo Contrast  12/30/2015  CLINICAL DATA:  Injuries to left head and left shoulder of unknown etiology, and subsequently ran out of ER, went over a concrete wall and fell onto ground. Concern for cervical spine injury. Initial encounter. EXAM: CT HEAD WITHOUT CONTRAST CT CERVICAL SPINE WITHOUT CONTRAST TECHNIQUE: Multidetector CT imaging of the head and cervical spine was performed following  the standard protocol without intravenous contrast. Multiplanar CT image reconstructions of the cervical spine were also generated. COMPARISON:  None. FINDINGS: CT HEAD FINDINGS There appears to be trace subarachnoid hemorrhage overlying the central right frontal lobe. There is no additional evidence of intra or extra-axial hemorrhage. No mass lesion or focal infarct is seen. Prominence of the sulci suggests mild cortical volume loss. Mild cerebellar atrophy is noted. Mild subcortical white matter change likely reflects small vessel ischemic microangiopathy. The  brainstem and fourth ventricle are within normal limits. The basal ganglia are unremarkable in appearance. The cerebral hemispheres demonstrate grossly normal gray-white differentiation. No mass effect or midline shift is seen. There is a significantly comminuted fracture of the nasal bone, with overlying soft tissue swelling and disruption. In addition, there appears to be a fracture through the right orbital floor, extending along the anterior wall of the right maxillary sinus, without evidence of herniation of intraorbital fat. Blood is noted partially filling the right maxillary sinus. No intraorbital air is seen; no intraorbital hemorrhage is identified. The left orbit is unremarkable in appearance. The remaining paranasal sinuses and mastoid air cells are well-aerated. Mild soft tissue swelling is noted at the right occiput, and lateral to the left orbit. Soft tissue swelling extends over the left zygomatic arch. CT CERVICAL SPINE FINDINGS There is no evidence of fracture or subluxation. Vertebral bodies demonstrate normal height. Mild multilevel disc space narrowing is noted along the cervical spine, with scattered anterior and posterior disc osteophyte complexes. There is mild grade 1 anterolisthesis of C6 on C7. There is chronic osseous fusion of the facets on the right side at C3-C4. A 1.2 cm hypodensity with minimal calcification at the right thyroid lobe is likely benign, given its size. The visualized lung apices are clear. No significant soft tissue abnormalities are seen. IMPRESSION: 1. Trace subarachnoid hemorrhage overlying the central right frontal lobe. 2. Fracture through the right orbital floor, extending along the anterior wall of the right maxillary sinus, without evidence of herniation of intraorbital fat. Blood partially fills the right maxillary sinus. The right orbit is otherwise unremarkable in appearance. 3. Significantly comminuted fracture of the nasal bone, with overlying soft tissue  swelling and disruption. 4. No evidence of fracture or subluxation along the cervical spine. 5. Mild soft tissue swelling at the right occiput, and lateral to the left orbit. Soft tissue swelling extends overlying the left zygomatic arch. 6. Mild degenerative change along the cervical spine. 7. Mild cortical volume loss and scattered small vessel ischemic microangiopathy. Critical Value/emergent results were called by telephone at the time of interpretation on 12/30/2015 at 3:07 am to Dr. Joseph Berkshire, who verbally acknowledged these results. Electronically Signed   By: Garald Balding M.D.   On: 12/30/2015 03:08   Ct Abdomen Pelvis W Contrast  12/30/2015  CLINICAL DATA:  Trauma, fall. Patient was in ED earlier with injuries to head and shoulder, he did not know what caused the injuries. Ran out of ED, followed to parking deck, went over a concrete wall onto ground, injury to nose. EXAM: CT CHEST, ABDOMEN, AND PELVIS WITH CONTRAST TECHNIQUE: Multidetector CT imaging of the chest, abdomen and pelvis was performed following the standard protocol during bolus administration of intravenous contrast. CONTRAST:  160m ISOVUE-300 IOPAMIDOL (ISOVUE-300) INJECTION 61% COMPARISON:  None. FINDINGS: CT CHEST FINDINGS No acute traumatic aortic injury. No mediastinal hematoma. No pleural or pericardial effusion. There is elevation of the left hemidiaphragm. Breathing motion artifact limits assessment. No pulmonary contusion. No pneumothorax or  pneumomediastinum. The sternum is intact. Remote right rib fractures. No acute rib fracture. Included clavicle and shoulder girdles intact. Age-indeterminate mild compression deformity superior endplate of T5, suspect this is chronic. No soft tissue stranding of the chest wall. CT ABDOMEN AND PELVIS FINDINGS Breathing motion artifact through the upper abdomen. No acute traumatic injury to the liver, gallbladder, spleen, pancreas, kidneys, or adrenal glands. Gallbladder is distended,  question dependent gallstones versus motion. Bilateral renal cysts, largest in the left upper pole. The stomach is distended with ingested contents. There are no dilated or thickened bowel loops. There are prominent mesenteric lymph nodes adjacent to the descending colon. Additional prominent nodes scattered throughout the mesenteric. The adjacent colon is decompressed without gross evidence of focal mass. No mesenteric hematoma. No free air, free fluid, or intra-abdominal fluid collection. No retroperitoneal fluid. The IVC appears intact. No retroperitoneal adenopathy. Abdominal aorta is normal in caliber. Within the pelvis the bladder is physiologically distended without wall thickening. No free fluid in the pelvis. Soft tissue stranding and rounded density in the left flank consistent with subcutaneous contusion. No active extravasation. Fat in the left inguinal canal is incidentally noted. Bony pelvis is intact without fracture. Lumbar spine is intact without fracture. IMPRESSION: 1. Subcutaneous soft tissue contusion to the left flank. 2. Age indeterminate compression deformity of T5, however suspect this is chronic. 3. No traumatic intra-thoracic or abdominal injury. Additional nontraumatic findings as described. Electronically Signed   By: Jeb Levering M.D.   On: 12/30/2015 03:01   Dg Knee Complete 4 Views Left  12/30/2015  CLINICAL DATA:  Fall tonight, left knee pain. EXAM: LEFT KNEE - COMPLETE 4+ VIEW COMPARISON:  None. FINDINGS: No fracture or dislocation. Mild patellofemoral joint space narrowing and spurring. Small peripheral spurs in the medial compartment with preservation of joint space. No joint effusion. IMPRESSION: No fracture or dislocation of the left knee. Electronically Signed   By: Jeb Levering M.D.   On: 12/30/2015 00:48    Review of Systems  Constitutional: Negative.   HENT: Negative.   Eyes: Negative.   Respiratory: Negative.   Cardiovascular: Negative.     Gastrointestinal: Negative.   Genitourinary: Negative.   Musculoskeletal: Negative.   Psychiatric/Behavioral: Negative.    Blood pressure 176/103, pulse 82, temperature 98.8 F (37.1 C), temperature source Oral, resp. rate 18, height 5' 11"  (1.803 m), weight 90.4 kg (199 lb 4.7 oz), SpO2 100 %. Physical Exam  Constitutional: He is oriented to person, place, and time. He appears well-developed and well-nourished.  HENT:  Head: Normocephalic.  Right Ear: External ear normal.  Left Ear: External ear normal.  Nose:    Eyes: EOM are normal. Pupils are equal, round, and reactive to light.  Cardiovascular: Normal rate.   Respiratory: Effort normal. No respiratory distress.  GI: Soft.  Neurological: He is alert and oriented to person, place, and time.  Skin: Skin is warm.  Psychiatric: He has a normal mood and affect. His behavior is normal. Judgment and thought content normal.    Assessment/Plan: Plan on nasal septal repair in next 1 - 2 weeks.  We need to let the skin heal a little before placing the splint.  Do not blow nose. Keep head elevated 45 degrees as able.  Good oral hygiene.  Multivitamin and vit C daily.   Wallace Going 12/30/2015, 6:35 PM

## 2015-12-30 NOTE — Progress Notes (Signed)
Patient reports that he drinks alcohol about every day, typically about 2-  40oz bottles.

## 2015-12-30 NOTE — H&P (Addendum)
History   Jeffrey Solomon is an 54 y.o. male.   Chief Complaint:  Chief Complaint  Patient presents with  . Facial Injury   He initially presented to the emergency room after being found down and intoxicated. He is noted to have multiple abrasions. He then ran out of the emergency room against medical device, tried to jump over a concrete wall, and fell onto his face. He was then brought back to the emergency room by EMS. He was evaluated and noted to have a large laceration of the nose. CT scan of the head demonstrated a small frontal subarachnoid hemorrhage, comminuted nasal fracture, right orbital fracture with some soft tissue swelling. CT of the neck was negative for acute trauma but did show a small right thyroid nodule. CT chest demonstrated no acute trauma. CT of the abdomen and pelvis also demonstrated no acute trauma. Given the findings on the head CT, we were asked to evaluate him. His only complaint currently is of a dry mouth. He denies pain. He does state he drinks alcohol daily and has had 2 to 3 DWI's in the past. He is not working.  Facial Injury Mechanism of injury:  Fall Location:  Face and nose Associated symptoms: no headaches and no neck pain     Past Medical History  Diagnosis Date  . Diabetes mellitus without complication ([place])   . Hypertension     OSA-does not use CPAP    L4-L5 herniated disc  pertinent past surgical history-UPP for sleep apnea    History reviewed. No pertinent family history. Social History:  reports that he has been smoking.  He does not have any smokeless tobacco history on file. He reports that he drinks alcohol. His drug history is not on file.  Allergies  No Known Allergies  Home Medications   (Not in a hospital admission)  Trauma Course   Results for orders placed or performed during the hospital encounter of 12/30/15 (from the past 48 hour(s))  Urine rapid drug screen (hosp performed)     Status: None   Collection Time:  12/30/15  2:24 AM  Result Value Ref Range   Opiates NONE DETECTED NONE DETECTED   Cocaine NONE DETECTED NONE DETECTED   Benzodiazepines NONE DETECTED NONE DETECTED   Amphetamines NONE DETECTED NONE DETECTED   Tetrahydrocannabinol NONE DETECTED NONE DETECTED   Barbiturates NONE DETECTED NONE DETECTED    Comment:        DRUG SCREEN FOR MEDICAL PURPOSES ONLY.  IF CONFIRMATION IS NEEDED FOR ANY PURPOSE, NOTIFY LAB WITHIN 5 DAYS.        LOWEST DETECTABLE LIMITS FOR URINE DRUG SCREEN Drug Class       Cutoff (ng/mL) Amphetamine      1000 Barbiturate      200 Benzodiazepine   409 Tricyclics       735 Opiates          300 Cocaine          300 THC              50   Urinalysis, Routine w reflex microscopic (not at [facility])     Status: Abnormal   Collection Time: 12/30/15  2:24 AM  Result Value Ref Range   Color, Urine YELLOW YELLOW   APPearance CLEAR CLEAR   Specific Gravity, Urine 1.008 1.005 - 1.030   pH 5.0 5.0 - 8.0   Glucose, UA 500 (A) NEGATIVE mg/dL   Hgb urine dipstick LARGE (A) NEGATIVE  Bilirubin Urine NEGATIVE NEGATIVE   Ketones, ur NEGATIVE NEGATIVE mg/dL   Protein, ur 30 (A) NEGATIVE mg/dL   Nitrite NEGATIVE NEGATIVE   Leukocytes, UA NEGATIVE NEGATIVE  Urine microscopic-add on     Status: None   Collection Time: 12/30/15  2:24 AM  Result Value Ref Range   Squamous Epithelial / LPF NONE SEEN NONE SEEN   WBC, UA NONE SEEN 0 - 5 WBC/hpf   RBC / HPF NONE SEEN 0 - 5 RBC/hpf   Bacteria, UA NONE SEEN NONE SEEN  CBC with Differential/Platelet     Status: Abnormal   Collection Time: 12/30/15  2:38 AM  Result Value Ref Range   WBC 16.5 (H) 4.0 - 10.5 K/uL   RBC 4.34 4.22 - 5.81 MIL/uL   Hemoglobin 13.8 13.0 - 17.0 g/dL   HCT 40.0 39.0 - 52.0 %   MCV 92.2 78.0 - 100.0 fL   MCH 31.8 26.0 - 34.0 pg   MCHC 34.5 30.0 - 36.0 g/dL   RDW 12.7 11.5 - 15.5 %   Platelets 273 150 - 400 K/uL   Neutrophils Relative % 75 %   Neutro Abs 12.5 (H) 1.7 - 7.7 K/uL   Lymphocytes Relative  14 %   Lymphs Abs 2.2 0.7 - 4.0 K/uL   Monocytes Relative 10 %   Monocytes Absolute 1.6 (H) 0.1 - 1.0 K/uL   Eosinophils Relative 0 %   Eosinophils Absolute 0.0 0.0 - 0.7 K/uL   Basophils Relative 1 %   Basophils Absolute 0.1 0.0 - 0.1 K/uL  Comprehensive metabolic panel     Status: Abnormal   Collection Time: 12/30/15  2:38 AM  Result Value Ref Range   Sodium 130 (L) 135 - 145 mmol/L   Potassium 3.2 (L) 3.5 - 5.1 mmol/L   Chloride 100 (L) 101 - 111 mmol/L   CO2 19 (L) 22 - 32 mmol/L   Glucose, Bld 135 (H) 65 - 99 mg/dL   BUN 7 6 - 20 mg/dL   Creatinine, Ser 0.66 0.61 - 1.24 mg/dL   Calcium 8.9 8.9 - 10.3 mg/dL   Total Protein 6.3 (L) 6.5 - 8.1 g/dL   Albumin 3.7 3.5 - 5.0 g/dL   AST 75 (H) 15 - 41 U/L   ALT 34 17 - 63 U/L   Alkaline Phosphatase 67 38 - 126 U/L   Total Bilirubin 0.8 0.3 - 1.2 mg/dL   GFR calc non Af Amer >60 >60 mL/min   GFR calc Af Amer >60 >60 mL/min    Comment: (NOTE) The eGFR has been calculated using the CKD EPI equation. This calculation has not been validated in all clinical situations. eGFR's persistently <60 mL/min signify possible Chronic Kidney Disease.    Anion gap 11 5 - 15  Protime-INR     Status: None   Collection Time: 12/30/15  2:38 AM  Result Value Ref Range   Prothrombin Time 15.0 11.6 - 15.2 seconds   INR 1.16 0.00 - 1.49   Ct Head Wo Contrast  12/30/2015  CLINICAL DATA:  Injuries to left head and left shoulder of unknown etiology, and subsequently ran out of ER, went over a concrete wall and fell onto ground. Concern for cervical spine injury. Initial encounter. EXAM: CT HEAD WITHOUT CONTRAST CT CERVICAL SPINE WITHOUT CONTRAST TECHNIQUE: Multidetector CT imaging of the head and cervical spine was performed following the standard protocol without intravenous contrast. Multiplanar CT image reconstructions of the cervical spine were also generated. COMPARISON:  None.  FINDINGS: CT HEAD FINDINGS There appears to be trace subarachnoid hemorrhage  overlying the central right frontal lobe. There is no additional evidence of intra or extra-axial hemorrhage. No mass lesion or focal infarct is seen. Prominence of the sulci suggests mild cortical volume loss. Mild cerebellar atrophy is noted. Mild subcortical white matter change likely reflects small vessel ischemic microangiopathy. The brainstem and fourth ventricle are within normal limits. The basal ganglia are unremarkable in appearance. The cerebral hemispheres demonstrate grossly normal gray-white differentiation. No mass effect or midline shift is seen. There is a significantly comminuted fracture of the nasal bone, with overlying soft tissue swelling and disruption. In addition, there appears to be a fracture through the right orbital floor, extending along the anterior wall of the right maxillary sinus, without evidence of herniation of intraorbital fat. Blood is noted partially filling the right maxillary sinus. No intraorbital air is seen; no intraorbital hemorrhage is identified. The left orbit is unremarkable in appearance. The remaining paranasal sinuses and mastoid air cells are well-aerated. Mild soft tissue swelling is noted at the right occiput, and lateral to the left orbit. Soft tissue swelling extends over the left zygomatic arch. CT CERVICAL SPINE FINDINGS There is no evidence of fracture or subluxation. Vertebral bodies demonstrate normal height. Mild multilevel disc space narrowing is noted along the cervical spine, with scattered anterior and posterior disc osteophyte complexes. There is mild grade 1 anterolisthesis of C6 on C7. There is chronic osseous fusion of the facets on the right side at C3-C4. A 1.2 cm hypodensity with minimal calcification at the right thyroid lobe is likely benign, given its size. The visualized lung apices are clear. No significant soft tissue abnormalities are seen. IMPRESSION: 1. Trace subarachnoid hemorrhage overlying the central right frontal lobe. 2.  Fracture through the right orbital floor, extending along the anterior wall of the right maxillary sinus, without evidence of herniation of intraorbital fat. Blood partially fills the right maxillary sinus. The right orbit is otherwise unremarkable in appearance. 3. Significantly comminuted fracture of the nasal bone, with overlying soft tissue swelling and disruption. 4. No evidence of fracture or subluxation along the cervical spine. 5. Mild soft tissue swelling at the right occiput, and lateral to the left orbit. Soft tissue swelling extends overlying the left zygomatic arch. 6. Mild degenerative change along the cervical spine. 7. Mild cortical volume loss and scattered small vessel ischemic microangiopathy. Critical Value/emergent results were called by telephone at the time of interpretation on 12/30/2015 at 3:07 am to Dr. Joseph Berkshire, who verbally acknowledged these results. Electronically Signed   By: Garald Balding M.D.   On: 12/30/2015 03:08   Ct Chest W Contrast  12/30/2015  CLINICAL DATA:  Trauma, fall. Patient was in ED earlier with injuries to head and shoulder, he did not know what caused the injuries. Ran out of ED, followed to parking deck, went over a concrete wall onto ground, injury to nose. EXAM: CT CHEST, ABDOMEN, AND PELVIS WITH CONTRAST TECHNIQUE: Multidetector CT imaging of the chest, abdomen and pelvis was performed following the standard protocol during bolus administration of intravenous contrast. CONTRAST:  152m ISOVUE-300 IOPAMIDOL (ISOVUE-300) INJECTION 61% COMPARISON:  None. FINDINGS: CT CHEST FINDINGS No acute traumatic aortic injury. No mediastinal hematoma. No pleural or pericardial effusion. There is elevation of the left hemidiaphragm. Breathing motion artifact limits assessment. No pulmonary contusion. No pneumothorax or pneumomediastinum. The sternum is intact. Remote right rib fractures. No acute rib fracture. Included clavicle and shoulder girdles intact.  Age-indeterminate  mild compression deformity superior endplate of T5, suspect this is chronic. No soft tissue stranding of the chest wall. CT ABDOMEN AND PELVIS FINDINGS Breathing motion artifact through the upper abdomen. No acute traumatic injury to the liver, gallbladder, spleen, pancreas, kidneys, or adrenal glands. Gallbladder is distended, question dependent gallstones versus motion. Bilateral renal cysts, largest in the left upper pole. The stomach is distended with ingested contents. There are no dilated or thickened bowel loops. There are prominent mesenteric lymph nodes adjacent to the descending colon. Additional prominent nodes scattered throughout the mesenteric. The adjacent colon is decompressed without gross evidence of focal mass. No mesenteric hematoma. No free air, free fluid, or intra-abdominal fluid collection. No retroperitoneal fluid. The IVC appears intact. No retroperitoneal adenopathy. Abdominal aorta is normal in caliber. Within the pelvis the bladder is physiologically distended without wall thickening. No free fluid in the pelvis. Soft tissue stranding and rounded density in the left flank consistent with subcutaneous contusion. No active extravasation. Fat in the left inguinal canal is incidentally noted. Bony pelvis is intact without fracture. Lumbar spine is intact without fracture. IMPRESSION: 1. Subcutaneous soft tissue contusion to the left flank. 2. Age indeterminate compression deformity of T5, however suspect this is chronic. 3. No traumatic intra-thoracic or abdominal injury. Additional nontraumatic findings as described. Electronically Signed   By: Jeb Levering M.D.   On: 12/30/2015 03:01   Ct Cervical Spine Wo Contrast  12/30/2015  CLINICAL DATA:  Injuries to left head and left shoulder of unknown etiology, and subsequently ran out of ER, went over a concrete wall and fell onto ground. Concern for cervical spine injury. Initial encounter. EXAM: CT HEAD WITHOUT CONTRAST  CT CERVICAL SPINE WITHOUT CONTRAST TECHNIQUE: Multidetector CT imaging of the head and cervical spine was performed following the standard protocol without intravenous contrast. Multiplanar CT image reconstructions of the cervical spine were also generated. COMPARISON:  None. FINDINGS: CT HEAD FINDINGS There appears to be trace subarachnoid hemorrhage overlying the central right frontal lobe. There is no additional evidence of intra or extra-axial hemorrhage. No mass lesion or focal infarct is seen. Prominence of the sulci suggests mild cortical volume loss. Mild cerebellar atrophy is noted. Mild subcortical white matter change likely reflects small vessel ischemic microangiopathy. The brainstem and fourth ventricle are within normal limits. The basal ganglia are unremarkable in appearance. The cerebral hemispheres demonstrate grossly normal gray-white differentiation. No mass effect or midline shift is seen. There is a significantly comminuted fracture of the nasal bone, with overlying soft tissue swelling and disruption. In addition, there appears to be a fracture through the right orbital floor, extending along the anterior wall of the right maxillary sinus, without evidence of herniation of intraorbital fat. Blood is noted partially filling the right maxillary sinus. No intraorbital air is seen; no intraorbital hemorrhage is identified. The left orbit is unremarkable in appearance. The remaining paranasal sinuses and mastoid air cells are well-aerated. Mild soft tissue swelling is noted at the right occiput, and lateral to the left orbit. Soft tissue swelling extends over the left zygomatic arch. CT CERVICAL SPINE FINDINGS There is no evidence of fracture or subluxation. Vertebral bodies demonstrate normal height. Mild multilevel disc space narrowing is noted along the cervical spine, with scattered anterior and posterior disc osteophyte complexes. There is mild grade 1 anterolisthesis of C6 on C7. There is  chronic osseous fusion of the facets on the right side at C3-C4. A 1.2 cm hypodensity with minimal calcification at the right thyroid lobe is  likely benign, given its size. The visualized lung apices are clear. No significant soft tissue abnormalities are seen. IMPRESSION: 1. Trace subarachnoid hemorrhage overlying the central right frontal lobe. 2. Fracture through the right orbital floor, extending along the anterior wall of the right maxillary sinus, without evidence of herniation of intraorbital fat. Blood partially fills the right maxillary sinus. The right orbit is otherwise unremarkable in appearance. 3. Significantly comminuted fracture of the nasal bone, with overlying soft tissue swelling and disruption. 4. No evidence of fracture or subluxation along the cervical spine. 5. Mild soft tissue swelling at the right occiput, and lateral to the left orbit. Soft tissue swelling extends overlying the left zygomatic arch. 6. Mild degenerative change along the cervical spine. 7. Mild cortical volume loss and scattered small vessel ischemic microangiopathy. Critical Value/emergent results were called by telephone at the time of interpretation on 12/30/2015 at 3:07 am to Dr. Joseph Berkshire, who verbally acknowledged these results. Electronically Signed   By: Garald Balding M.D.   On: 12/30/2015 03:08   Ct Abdomen Pelvis W Contrast  12/30/2015  CLINICAL DATA:  Trauma, fall. Patient was in ED earlier with injuries to head and shoulder, he did not know what caused the injuries. Ran out of ED, followed to parking deck, went over a concrete wall onto ground, injury to nose. EXAM: CT CHEST, ABDOMEN, AND PELVIS WITH CONTRAST TECHNIQUE: Multidetector CT imaging of the chest, abdomen and pelvis was performed following the standard protocol during bolus administration of intravenous contrast. CONTRAST:  125m ISOVUE-300 IOPAMIDOL (ISOVUE-300) INJECTION 61% COMPARISON:  None. FINDINGS: CT CHEST FINDINGS No acute traumatic  aortic injury. No mediastinal hematoma. No pleural or pericardial effusion. There is elevation of the left hemidiaphragm. Breathing motion artifact limits assessment. No pulmonary contusion. No pneumothorax or pneumomediastinum. The sternum is intact. Remote right rib fractures. No acute rib fracture. Included clavicle and shoulder girdles intact. Age-indeterminate mild compression deformity superior endplate of T5, suspect this is chronic. No soft tissue stranding of the chest wall. CT ABDOMEN AND PELVIS FINDINGS Breathing motion artifact through the upper abdomen. No acute traumatic injury to the liver, gallbladder, spleen, pancreas, kidneys, or adrenal glands. Gallbladder is distended, question dependent gallstones versus motion. Bilateral renal cysts, largest in the left upper pole. The stomach is distended with ingested contents. There are no dilated or thickened bowel loops. There are prominent mesenteric lymph nodes adjacent to the descending colon. Additional prominent nodes scattered throughout the mesenteric. The adjacent colon is decompressed without gross evidence of focal mass. No mesenteric hematoma. No free air, free fluid, or intra-abdominal fluid collection. No retroperitoneal fluid. The IVC appears intact. No retroperitoneal adenopathy. Abdominal aorta is normal in caliber. Within the pelvis the bladder is physiologically distended without wall thickening. No free fluid in the pelvis. Soft tissue stranding and rounded density in the left flank consistent with subcutaneous contusion. No active extravasation. Fat in the left inguinal canal is incidentally noted. Bony pelvis is intact without fracture. Lumbar spine is intact without fracture. IMPRESSION: 1. Subcutaneous soft tissue contusion to the left flank. 2. Age indeterminate compression deformity of T5, however suspect this is chronic. 3. No traumatic intra-thoracic or abdominal injury. Additional nontraumatic findings as described.  Electronically Signed   By: MJeb LeveringM.D.   On: 12/30/2015 03:01   Dg Knee Complete 4 Views Left  12/30/2015  CLINICAL DATA:  Fall tonight, left knee pain. EXAM: LEFT KNEE - COMPLETE 4+ VIEW COMPARISON:  None. FINDINGS: No fracture or dislocation. Mild patellofemoral  joint space narrowing and spurring. Small peripheral spurs in the medial compartment with preservation of joint space. No joint effusion. IMPRESSION: No fracture or dislocation of the left knee. Electronically Signed   By: Jeb Levering M.D.   On: 12/30/2015 00:48    Review of Systems  Respiratory: Negative for shortness of breath.   Cardiovascular: Negative for chest pain.  Gastrointestinal: Negative for abdominal pain.  Musculoskeletal: Negative for back pain, joint pain and neck pain.  Neurological: Negative for headaches.    There were no vitals taken for this visit. Physical Exam  Constitutional: He is oriented to person, place, and time. He appears well-developed and well-nourished. No distress.  HENT:  Swelling and bruising around the right orbit. Swelling around the left orbit. Multiple sutures in the nose. Nasal deformity is present with swelling.  Eyes: EOM are normal. Pupils are equal, round, and reactive to light.  Neck:  C collar on. No cervical spine tenderness.  Cardiovascular: Normal rate and regular rhythm.   Respiratory: Effort normal and breath sounds normal. He exhibits no tenderness.  GI: Soft. There is no tenderness.  Musculoskeletal:  No spinal tenderness to palpation. Small abrasions on both knees. No arm or leg bony deformities or tenderness.  Neurological: He is alert and oriented to person, place, and time.  Good motor strength of upper and lower extremities.  GCS=15     Assessment/Plan S/p fall with following injuries:  1.  Small frontal SAH-no neurologic deficits on exam 2.  Open comminuted nasal fracture 3.  Right orbital fracture. 4.  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 5.  Incidental 1.2 cm hypodensity in right thyroid lobe. 6.  ETOH abuse  Plan:  Transfer to [facility] center and admit to 9[facility].  Neurosurgery and Maxillofacial Trauma consults when he get there.  CIWA protocol. Follow up a thyroid abnormality with PCP.  Luie Laneve J 12/30/2015, 4:14 AM   Procedures

## 2015-12-30 NOTE — ED Notes (Signed)
Applied Quick clot to patients nose

## 2015-12-30 NOTE — Progress Notes (Signed)
CRITICAL VALUE ALERT  Critical value received:  Blood alcohol level 301  Date of notification:  12/30/15  Time of notification:  0058  Critical value read back:Yes.    Nurse who received alert:  Pernell DupreLaurie Toriano Aikey, Norwood LevoAC  MD notified (1st page):  No longer a patient, checked out AMA, jumped from deck and 911 called, Reported to EMS and ED CN Victorino DikeJennifer Oxidene of level.  He was brought back in by EMS to ED   Time of first page:  n/a  MD notified (2nd page):n/a  Time of second page: n/a Responding MD: EMS notified and Victorino DikeJennifer ED CN  Time MD responded:  [phone]

## 2015-12-31 LAB — GLUCOSE, CAPILLARY: Glucose-Capillary: 162 mg/dL — ABNORMAL HIGH (ref 65–99)

## 2015-12-31 MED ORDER — CHLORTHALIDONE 25 MG PO TABS: 25.0000 mg | ORAL_TABLET | Freq: Every day | ORAL | Status: DC

## 2015-12-31 MED ORDER — HYDRALAZINE HCL 20 MG/ML IJ SOLN
20.0000 mg | Freq: Three times a day (TID) | INTRAMUSCULAR | Status: DC | PRN
  Administered 2015-12-31: 20 mg via INTRAVENOUS
  Filled 2015-12-31: qty 1

## 2015-12-31 MED ORDER — CEPHALEXIN 500 MG PO CAPS: 500.0000 mg | ORAL_CAPSULE | Freq: Four times a day (QID) | ORAL | Status: DC

## 2015-12-31 MED ORDER — ACETAMINOPHEN 325 MG PO TABS
650.0000 mg | ORAL_TABLET | Freq: Four times a day (QID) | ORAL | Status: DC | PRN
  Administered 2015-12-31: 650 mg via ORAL
  Filled 2015-12-31: qty 2

## 2015-12-31 NOTE — Discharge Summary (Signed)
Physician Discharge Summary  Patient ID:  Jeffrey Solomon  MRN: [id number]  DOB/AGE: 03-22-1961 55 y.o.  Admit date: 12/30/2015 Discharge date: 12/31/2015  Discharge Diagnoses:  1. Small frontal SAH-no neurologic deficits on exam Seen by Dr. Dub Amis. Jones - no plans for further imaging 2. Open comminuted nasal fracture Seen by Dr. Ulice Boldillingham - will follow up 3. Right orbital fracture. 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 5. Incidental 1.2 cm hypodensity in right thyroid lobe.  I gave copy of CT scan to patient - probably benign - follow up with PCP 6. ETOH abuse Admission EtOH - 301 - 12/30/2015 7. HTN  His PCP died in Feb 2017 - he has not renewed his BP meds since then. I discussed with him - he needs a PCP and he needs to address his HTN. He does not know or remember what BP meds he was on.  I will start chlortalidone for now.   Active Problems:   SAH (subarachnoid hemorrhage) [facility](HCC)   Discharged Condition: good  Hospital Course: Jeffrey Solomon is an 55 y.o. male whose primary care physician is No primary care provider on file. and who was admitted 12/30/2015 with a chief complaint of  Chief Complaint  Patient presents with  . Facial Injury   He was seen by Dr. [facility]bower [facility].   He initially presented to the emergency room after being found down and intoxicated. He is noted to have multiple abrasions. He then ran out of the emergency room against medical device, tried to jump over a concrete wall, and fell onto his face. He was then brought back to the emergency room by EMS.  He was evaluated and noted to have a large laceration of the nose. CT scan of the head demonstrated a small frontal subarachnoid hemorrhage, comminuted nasal fracture, right orbital fracture with some soft tissue swelling. CT of the neck was negative for acute trauma but did show a small right thyroid nodule.   He  [facility] for observation. He was seen by Dr. Dub Amis. Jones, but no further imaging suggested unless he had trouble. He was seen by Dr. Ulice Boldillingham for the open nasal bone fracture.   She will plan on a repair in one to 2 weeks, but wants the skin to heal some first.  He is neurologically intact this morning.  The alcohol has worn off and he is fairly pleasant.  He lives with his mother.  He is divorced. The discharge instructions were reviewed with the patient.  Consults: Neurosurgery and plastic surgery  Significant Diagnostic Studies: Results for orders placed or performed during the hospital encounter of 12/30/15  CBC with Differential/Platelet  Result Value Ref Range   WBC 16.5 (H) 4.0 - 10.5 K/uL   RBC 4.34 4.22 - 5.81 MIL/uL   Hemoglobin 13.8 13.0 - 17.0 g/dL   HCT 56.240.0 13.039.0 - 86.552.0 %   MCV 92.2 78.0 - 100.0 fL   MCH 31.8 26.0 - 34.0 pg   MCHC 34.5 30.0 - 36.0 g/dL   RDW 78.412.7 69.611.5 - 29.515.5 %   Platelets 273 150 - 400 K/uL   Neutrophils Relative % 75 %   Neutro Abs 12.5 (H) 1.7 - 7.7 K/uL   Lymphocytes Relative 14 %   Lymphs Abs 2.2 0.7 - 4.0 K/uL   Monocytes Relative 10 %   Monocytes Absolute 1.6 (H) 0.1 - 1.0 K/uL   Eosinophils Relative 0 %   Eosinophils Absolute 0.0  0.0 - 0.7 K/uL   Basophils Relative 1 %   Basophils Absolute 0.1 0.0 - 0.1 K/uL  Comprehensive metabolic panel  Result Value Ref Range   Sodium 130 (L) 135 - 145 mmol/L   Potassium 3.2 (L) 3.5 - 5.1 mmol/L   Chloride 100 (L) 101 - 111 mmol/L   CO2 19 (L) 22 - 32 mmol/L   Glucose, Bld 135 (H) 65 - 99 mg/dL   BUN 7 6 - 20 mg/dL   Creatinine, Ser 1.61 0.61 - 1.24 mg/dL   Calcium 8.9 8.9 - 09.6 mg/dL   Total Protein 6.3 (L) 6.5 - 8.1 g/dL   Albumin 3.7 3.5 - 5.0 g/dL   AST 75 (H) 15 - 41 U/L   ALT 34 17 - 63 U/L   Alkaline Phosphatase 67 38 - 126 U/L   Total Bilirubin 0.8 0.3 - 1.2 mg/dL   GFR calc non Af Amer >60 >60 mL/min   GFR calc Af Amer >60 >60 mL/min   Anion gap 11 5 - 15   Protime-INR  Result Value Ref Range   Prothrombin Time 15.0 11.6 - 15.2 seconds   INR 1.16 0.00 - 1.49  Urine rapid drug screen (hosp performed)  Result Value Ref Range   Opiates NONE DETECTED NONE DETECTED   Cocaine NONE DETECTED NONE DETECTED   Benzodiazepines NONE DETECTED NONE DETECTED   Amphetamines NONE DETECTED NONE DETECTED   Tetrahydrocannabinol NONE DETECTED NONE DETECTED   Barbiturates NONE DETECTED NONE DETECTED  Urinalysis, Routine w reflex microscopic (not at [facility])  Result Value Ref Range   Color, Urine YELLOW YELLOW   APPearance CLEAR CLEAR   Specific Gravity, Urine 1.008 1.005 - 1.030   pH 5.0 5.0 - 8.0   Glucose, UA 500 (A) NEGATIVE mg/dL   Hgb urine dipstick LARGE (A) NEGATIVE   Bilirubin Urine NEGATIVE NEGATIVE   Ketones, ur NEGATIVE NEGATIVE mg/dL   Protein, ur 30 (A) NEGATIVE mg/dL   Nitrite NEGATIVE NEGATIVE   Leukocytes, UA NEGATIVE NEGATIVE  Urine microscopic-add on  Result Value Ref Range   Squamous Epithelial / LPF NONE SEEN NONE SEEN   WBC, UA NONE SEEN 0 - 5 WBC/hpf   RBC / HPF NONE SEEN 0 - 5 RBC/hpf   Bacteria, UA NONE SEEN NONE SEEN  CBC  Result Value Ref Range   WBC 16.1 (H) 4.0 - 10.5 K/uL   RBC 4.30 4.22 - 5.81 MIL/uL   Hemoglobin 13.7 13.0 - 17.0 g/dL   HCT 04.5 (L) 40.9 - 81.1 %   MCV 90.5 78.0 - 100.0 fL   MCH 31.9 26.0 - 34.0 pg   MCHC 35.2 30.0 - 36.0 g/dL   RDW 91.4 78.2 - 95.6 %   Platelets 246 150 - 400 K/uL  Basic metabolic panel  Result Value Ref Range   Sodium 131 (L) 135 - 145 mmol/L   Potassium 4.0 3.5 - 5.1 mmol/L   Chloride 101 101 - 111 mmol/L   CO2 20 (L) 22 - 32 mmol/L   Glucose, Bld 155 (H) 65 - 99 mg/dL   BUN 9 6 - 20 mg/dL   Creatinine, Ser 2.13 0.61 - 1.24 mg/dL   Calcium 8.7 (L) 8.9 - 10.3 mg/dL   GFR calc non Af Amer >60 >60 mL/min   GFR calc Af Amer >60 >60 mL/min   Anion gap 10 5 - 15  Glucose, capillary  Result Value Ref Range   Glucose-Capillary 132 (H) 65 - 99 mg/dL  Glucose,  capillary  Result  Value Ref Range   Glucose-Capillary 118 (H) 65 - 99 mg/dL  Glucose, capillary  Result Value Ref Range   Glucose-Capillary 162 (H) 65 - 99 mg/dL   Comment 1 Notify RN    Comment 2 Document in Chart   CBG monitoring, ED  Result Value Ref Range   Glucose-Capillary 150 (H) 65 - 99 mg/dL    Ct Head Wo Contrast  12/30/2015  CLINICAL DATA:  Injuries to left head and left shoulder of unknown etiology, and subsequently ran out of ER, went over a concrete wall and fell onto ground. Concern for cervical spine injury. Initial encounter. EXAM: CT HEAD WITHOUT CONTRAST CT CERVICAL SPINE WITHOUT CONTRAST TECHNIQUE: Multidetector CT imaging of the head and cervical spine was performed following the standard protocol without intravenous contrast. Multiplanar CT image reconstructions of the cervical spine were also generated. COMPARISON:  None. FINDINGS: CT HEAD FINDINGS There appears to be trace subarachnoid hemorrhage overlying the central right frontal lobe. There is no additional evidence of intra or extra-axial hemorrhage. No mass lesion or focal infarct is seen. Prominence of the sulci suggests mild cortical volume loss. Mild cerebellar atrophy is noted. Mild subcortical white matter change likely reflects small vessel ischemic microangiopathy. The brainstem and fourth ventricle are within normal limits. The basal ganglia are unremarkable in appearance. The cerebral hemispheres demonstrate grossly normal gray-white differentiation. No mass effect or midline shift is seen. There is a significantly comminuted fracture of the nasal bone, with overlying soft tissue swelling and disruption. In addition, there appears to be a fracture through the right orbital floor, extending along the anterior wall of the right maxillary sinus, without evidence of herniation of intraorbital fat. Blood is noted partially filling the right maxillary sinus. No intraorbital air is seen; no intraorbital hemorrhage is identified. The left  orbit is unremarkable in appearance. The remaining paranasal sinuses and mastoid air cells are well-aerated. Mild soft tissue swelling is noted at the right occiput, and lateral to the left orbit. Soft tissue swelling extends over the left zygomatic arch. CT CERVICAL SPINE FINDINGS There is no evidence of fracture or subluxation. Vertebral bodies demonstrate normal height. Mild multilevel disc space narrowing is noted along the cervical spine, with scattered anterior and posterior disc osteophyte complexes. There is mild grade 1 anterolisthesis of C6 on C7. There is chronic osseous fusion of the facets on the right side at C3-C4. A 1.2 cm hypodensity with minimal calcification at the right thyroid lobe is likely benign, given its size. The visualized lung apices are clear. No significant soft tissue abnormalities are seen. IMPRESSION: 1. Trace subarachnoid hemorrhage overlying the central right frontal lobe. 2. Fracture through the right orbital floor, extending along the anterior wall of the right maxillary sinus, without evidence of herniation of intraorbital fat. Blood partially fills the right maxillary sinus. The right orbit is otherwise unremarkable in appearance. 3. Significantly comminuted fracture of the nasal bone, with overlying soft tissue swelling and disruption. 4. No evidence of fracture or subluxation along the cervical spine. 5. Mild soft tissue swelling at the right occiput, and lateral to the left orbit. Soft tissue swelling extends overlying the left zygomatic arch. 6. Mild degenerative change along the cervical spine. 7. Mild cortical volume loss and scattered small vessel ischemic microangiopathy. Critical Value/emergent results were called by telephone at the time of interpretation on 12/30/2015 at 3:07 am to Dr. Jaci Carrel, who verbally acknowledged these results. Electronically Signed   By: Beryle Beams.D.  On: 12/30/2015 03:08   Ct Chest W Contrast  12/30/2015  CLINICAL DATA:   Trauma, fall. Patient was in ED earlier with injuries to head and shoulder, he did not know what caused the injuries. Ran out of ED, followed to parking deck, went over a concrete wall onto ground, injury to nose. EXAM: CT CHEST, ABDOMEN, AND PELVIS WITH CONTRAST TECHNIQUE: Multidetector CT imaging of the chest, abdomen and pelvis was performed following the standard protocol during bolus administration of intravenous contrast. CONTRAST:  ISOVUE-300 IOPAMIDOL (ISOVUE-300) INJECTION 61% COMPARISON:  None. FINDINGS: CT CHEST FINDINGS No acute traumatic aortic injury. No mediastinal hematoma. No pleural or pericardial effusion. There is elevation of the left hemidiaphragm. Breathing motion artifact limits assessment. No pulmonary contusion. No pneumothorax or pneumomediastinum. The sternum is intact. Remote right rib fractures. No acute rib fracture. Included clavicle and shoulder girdles intact. Age-indeterminate mild compression deformity superior endplate of T5, suspect this is chronic. No soft tissue stranding of the chest wall. CT ABDOMEN AND PELVIS FINDINGS Breathing motion artifact through the upper abdomen. No acute traumatic injury to the liver, gallbladder, spleen, pancreas, kidneys, or adrenal glands. Gallbladder is distended, question dependent gallstones versus motion. Bilateral renal cysts, largest in the left upper pole. The stomach is distended with ingested contents. There are no dilated or thickened bowel loops. There are prominent mesenteric lymph nodes adjacent to the descending colon. Additional prominent nodes scattered throughout the mesenteric. The adjacent colon is decompressed without gross evidence of focal mass. No mesenteric hematoma. No free air, free fluid, or intra-abdominal fluid collection. No retroperitoneal fluid. The IVC appears intact. No retroperitoneal adenopathy. Abdominal aorta is normal in caliber. Within the pelvis the bladder is physiologically distended without wall  thickening. No free fluid in the pelvis. Soft tissue stranding and rounded density in the left flank consistent with subcutaneous contusion. No active extravasation. Fat in the left inguinal canal is incidentally noted. Bony pelvis is intact without fracture. Lumbar spine is intact without fracture. IMPRESSION: 1. Subcutaneous soft tissue contusion to the left flank. 2. Age indeterminate compression deformity of T5, however suspect this is chronic. 3. No traumatic intra-thoracic or abdominal injury. Additional nontraumatic findings as described. Electronically Signed   By: Rubye Oaks M.D.   On: 12/30/2015 03:01   Ct Cervical Spine Wo Contrast  12/30/2015  CLINICAL DATA:  Injuries to left head and left shoulder of unknown etiology, and subsequently ran out of ER, went over a concrete wall and fell onto ground. Concern for cervical spine injury. Initial encounter. EXAM: CT HEAD WITHOUT CONTRAST CT CERVICAL SPINE WITHOUT CONTRAST TECHNIQUE: Multidetector CT imaging of the head and cervical spine was performed following the standard protocol without intravenous contrast. Multiplanar CT image reconstructions of the cervical spine were also generated. COMPARISON:  None. FINDINGS: CT HEAD FINDINGS There appears to be trace subarachnoid hemorrhage overlying the central right frontal lobe. There is no additional evidence of intra or extra-axial hemorrhage. No mass lesion or focal infarct is seen. Prominence of the sulci suggests mild cortical volume loss. Mild cerebellar atrophy is noted. Mild subcortical white matter change likely reflects small vessel ischemic microangiopathy. The brainstem and fourth ventricle are within normal limits. The basal ganglia are unremarkable in appearance. The cerebral hemispheres demonstrate grossly normal gray-white differentiation. No mass effect or midline shift is seen. There is a significantly comminuted fracture of the nasal bone, with overlying soft tissue swelling and  disruption. In addition, there appears to be a fracture through the right orbital floor,  extending along the anterior wall of the right maxillary sinus, without evidence of herniation of intraorbital fat. Blood is noted partially filling the right maxillary sinus. No intraorbital air is seen; no intraorbital hemorrhage is identified. The left orbit is unremarkable in appearance. The remaining paranasal sinuses and mastoid air cells are well-aerated. Mild soft tissue swelling is noted at the right occiput, and lateral to the left orbit. Soft tissue swelling extends over the left zygomatic arch. CT CERVICAL SPINE FINDINGS There is no evidence of fracture or subluxation. Vertebral bodies demonstrate normal height. Mild multilevel disc space narrowing is noted along the cervical spine, with scattered anterior and posterior disc osteophyte complexes. There is mild grade 1 anterolisthesis of C6 on C7. There is chronic osseous fusion of the facets on the right side at C3-C4. A 1.2 cm hypodensity with minimal calcification at the right thyroid lobe is likely benign, given its size. The visualized lung apices are clear. No significant soft tissue abnormalities are seen. IMPRESSION: 1. Trace subarachnoid hemorrhage overlying the central right frontal lobe. 2. Fracture through the right orbital floor, extending along the anterior wall of the right maxillary sinus, without evidence of herniation of intraorbital fat. Blood partially fills the right maxillary sinus. The right orbit is otherwise unremarkable in appearance. 3. Significantly comminuted fracture of the nasal bone, with overlying soft tissue swelling and disruption. 4. No evidence of fracture or subluxation along the cervical spine. 5. Mild soft tissue swelling at the right occiput, and lateral to the left orbit. Soft tissue swelling extends overlying the left zygomatic arch. 6. Mild degenerative change along the cervical spine. 7. Mild cortical volume loss and  scattered small vessel ischemic microangiopathy. Critical Value/emergent results were called by telephone at the time of interpretation on 12/30/2015 at 3:07 am to Dr. Jaci Carrel, who verbally acknowledged these results. Electronically Signed   By: Roanna Raider M.D.   On: 12/30/2015 03:08   Ct Abdomen Pelvis W Contrast  12/30/2015  CLINICAL DATA:  Trauma, fall. Patient was in ED earlier with injuries to head and shoulder, he did not know what caused the injuries. Ran out of ED, followed to parking deck, went over a concrete wall onto ground, injury to nose. EXAM: CT CHEST, ABDOMEN, AND PELVIS WITH CONTRAST TECHNIQUE: Multidetector CT imaging of the chest, abdomen and pelvis was performed following the standard protocol during bolus administration of intravenous contrast. CONTRAST:  ISOVUE-300 IOPAMIDOL (ISOVUE-300) INJECTION 61% COMPARISON:  None. FINDINGS: CT CHEST FINDINGS No acute traumatic aortic injury. No mediastinal hematoma. No pleural or pericardial effusion. There is elevation of the left hemidiaphragm. Breathing motion artifact limits assessment. No pulmonary contusion. No pneumothorax or pneumomediastinum. The sternum is intact. Remote right rib fractures. No acute rib fracture. Included clavicle and shoulder girdles intact. Age-indeterminate mild compression deformity superior endplate of T5, suspect this is chronic. No soft tissue stranding of the chest wall. CT ABDOMEN AND PELVIS FINDINGS Breathing motion artifact through the upper abdomen. No acute traumatic injury to the liver, gallbladder, spleen, pancreas, kidneys, or adrenal glands. Gallbladder is distended, question dependent gallstones versus motion. Bilateral renal cysts, largest in the left upper pole. The stomach is distended with ingested contents. There are no dilated or thickened bowel loops. There are prominent mesenteric lymph nodes adjacent to the descending colon. Additional prominent nodes scattered throughout the  mesenteric. The adjacent colon is decompressed without gross evidence of focal mass. No mesenteric hematoma. No free air, free fluid, or intra-abdominal fluid collection. No retroperitoneal fluid. The  IVC appears intact. No retroperitoneal adenopathy. Abdominal aorta is normal in caliber. Within the pelvis the bladder is physiologically distended without wall thickening. No free fluid in the pelvis. Soft tissue stranding and rounded density in the left flank consistent with subcutaneous contusion. No active extravasation. Fat in the left inguinal canal is incidentally noted. Bony pelvis is intact without fracture. Lumbar spine is intact without fracture. IMPRESSION: 1. Subcutaneous soft tissue contusion to the left flank. 2. Age indeterminate compression deformity of T5, however suspect this is chronic. 3. No traumatic intra-thoracic or abdominal injury. Additional nontraumatic findings as described. Electronically Signed   By: Rubye Oaks M.D.   On: 12/30/2015 03:01   Dg Knee Complete 4 Views Left  12/30/2015  CLINICAL DATA:  Fall tonight, left knee pain. EXAM: LEFT KNEE - COMPLETE 4+ VIEW COMPARISON:  None. FINDINGS: No fracture or dislocation. Mild patellofemoral joint space narrowing and spurring. Small peripheral spurs in the medial compartment with preservation of joint space. No joint effusion. IMPRESSION: No fracture or dislocation of the left knee. Electronically Signed   By: Rubye Oaks M.D.   On: 12/30/2015 00:48    Discharge Exam:  Filed Vitals:   12/31/15 0433 12/31/15 0718  BP: 152/103 170/107  Pulse: 97   Temp:    Resp:      General: Older WM who is alert and oriented.  HEENT: Normal. Ecchymosis around both eyes, but vision and EOM is normal. Sutured laceration over the bridge of the nose, approx 4 cm. This looks clean. Neck: Supple, no pain. . Lungs: Clear Abdomen:  Soft Extremities: Abrasion over left knee   Neurologic:  Grossly intact to motor and sensory function.  Discharge Medications:     Medication List    TAKE these medications        cephALEXin 500 MG capsule  Commonly known as:  KEFLEX  Take 1 capsule (500 mg total) by mouth 4 (four) times daily.     chlorthalidone 25 MG tablet  Commonly known as:  HYGROTON  Take 1 tablet (25 mg total) by mouth daily.        Disposition: ED Dismiss - Never Arrived      Discharge Instructions    Diet - low sodium heart healthy    Complete by:  As directed      Increase activity slowly    Complete by:  As directed           Activity:  Lifting - As tolerated  Wound Care:   May shower and wash nose with soap and water  Diet:  As tolerated       No alcohol  Follow up appointment:  Call Dr. Kittie Plater office at [phone] to schedule repair of your nose.        You need to find and see a Primary Care Physician to manage your blood pressure.  The meds that are prescribed at discharge are only meant to be temporary and do not replace the active management of your blood pressure.  Medications and dosages:  You have a prescription for:  Keflex (for nose fracture) and Chlorthalidone (for BP)          Signed: Ovidio Kin, M.D., [facility] Surgery Office:  [phone]  12/31/2015, 12:02 PM

## 2015-12-31 NOTE — Progress Notes (Signed)
Pt's BP read 156/110 and 161/116 at 0230, Dr Dwain SarnaWakefield (on call) paged and notified, ordered iv hydralazine 20mg  same commenced at 0316, pt reassured, will continue to monitor. Obasogie-Asidi, Zauria Dombek Efe

## 2015-12-31 NOTE — Discharge Instructions (Signed)
[facility] - DISCHARGE INSTRUCTIONS TO PATIENT  Activity:  Lifting - As tolerated  Wound Care:   May shower and wash nose with soap and water  Diet:  As tolerated       No alcohol  Follow up appointment:  Call Dr. Kittie Platerillingham's office at [phone] to schedule repair of your nose.        You need to find and see a Primary Care Physician to manage your blood pressure.  The meds that are prescribed at discharge are only meant to be temporary and do not replace the active management of your blood pressure.  Medications and dosages:  You have a prescription for:  Keflex (for nose fracture) and Chlorthalidone (for BP)          Return to the ER if you have:  Temperature greater than 100.4,  Persistent nausea and vomiting,  Difficulty breathing, headache or visual disturbances,  In an emergency, call 911 or go to an Emergency Department at a nearby hospital.

## 2015-12-31 NOTE — Progress Notes (Signed)
Pt discharged home with mother. IV removed. Discharge instructions and prescriptions given. Pt questions asked and answered. Will transport from unit via wheelchair with volunteer services when mother arrives to take patient home. Lawson RadarHeather M Taron Mondor

## 2015-12-31 NOTE — Progress Notes (Signed)
[facility]:  [phone] General Surgery Progress Note   LOS: 1 day  POD -     Assessment/Plan: 1. Small frontal SAH-no neurologic deficits on exam  Seen by Dr. Dub Amis - no plans for further imaging  Clear today, wants to go home 2. Open comminuted nasal fracture  Seen by Dr. Ulice Bold - will give follow up 3. Right orbital fracture. 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 5. Incidental 1.2 cm hypodensity in right thyroid lobe. 6. ETOH abuse  Admission EtOH - 301 - 19-Jan-2016 7.  HTN   His PCP died in 08/24/2015- he has not renewed his BP meds since then.  I discussed with him - he needs a PCP and he needs to address his HTN.  He does not know or remember what BP meds he was on  8.  DVT prophylaxis - chemoprophylaxis on hold for bleed   Active Problems:   SAH (subarachnoid hemorrhage) (HCC)  Subjective:  Alert, talkative.  Oriented.  No complaint of pain or discomfort. I discussed his alcohol problems with him.   He also has untreated HTN.  He had a PCP, but has not taken his BP meds since 08/23/2022.  His PCP died (Dr. Vear Clock), he knows he needs a new PCP.  Objective:   Filed Vitals:   12/31/15 0433 12/31/15 0718  BP: 152/103 170/107  Pulse: 97   Temp:    Resp:       Intake/Output from previous day:  01-19-23 0701 - 07/08 0700 In: 502.5 [P.O.:250; I.V.:152.5; IV Piggyback:100] Out: 2515 [Urine:2515]  Intake/Output this shift:      Physical Exam:   General: Older WM who is alert and oriented.    HEENT: Normal. Ecchymosis around both eyes, but vision and EOM is normal.  Laceration over the bridge of the nose, approx 4 cm.  This looks clean.   Neck: Supple, no pain. .   Lungs: Clear   Abdomen: Soft   Extremities:  Abrasion over left knee.   Lab Results:    Recent Labs  01/19/2016 0238 Jan 19, 2016 0536  WBC 16.5* 16.1*  HGB 13.8 13.7  HCT 40.0 38.9*  PLT 273 246    BMET   Recent Labs  2016-01-19 0238  2016-01-19 0536  NA 130* 131*  K 3.2* 4.0  CL 100* 101  CO2 19* 20*  GLUCOSE 135* 155*  BUN 7 9  CREATININE 0.66 0.71  CALCIUM 8.9 8.7*    PT/INR   Recent Labs  01-19-2016 0238  LABPROT 15.0  INR 1.16    ABG  No results for input(s): PHART, HCO3 in the last 72 hours.  Invalid input(s): PCO2, PO2   Studies/Results:  Ct Head Wo Contrast  01/19/2016  CLINICAL DATA:  Injuries to left head and left shoulder of unknown etiology, and subsequently ran out of ER, went over a concrete wall and fell onto ground. Concern for cervical spine injury. Initial encounter. EXAM: CT HEAD WITHOUT CONTRAST CT CERVICAL SPINE WITHOUT CONTRAST TECHNIQUE: Multidetector CT imaging of the head and cervical spine was performed following the standard protocol without intravenous contrast. Multiplanar CT image reconstructions of the cervical spine were also generated. COMPARISON:  None. FINDINGS: CT HEAD FINDINGS There appears to be trace subarachnoid hemorrhage overlying the central right frontal lobe. There is no additional evidence of intra or extra-axial hemorrhage. No mass lesion or focal infarct is seen. Prominence of the sulci suggests mild cortical volume loss. Mild cerebellar atrophy is  noted. Mild subcortical white matter change likely reflects small vessel ischemic microangiopathy. The brainstem and fourth ventricle are within normal limits. The basal ganglia are unremarkable in appearance. The cerebral hemispheres demonstrate grossly normal gray-white differentiation. No mass effect or midline shift is seen. There is a significantly comminuted fracture of the nasal bone, with overlying soft tissue swelling and disruption. In addition, there appears to be a fracture through the right orbital floor, extending along the anterior wall of the right maxillary sinus, without evidence of herniation of intraorbital fat. Blood is noted partially filling the right maxillary sinus. No intraorbital air is seen; no intraorbital  hemorrhage is identified. The left orbit is unremarkable in appearance. The remaining paranasal sinuses and mastoid air cells are well-aerated. Mild soft tissue swelling is noted at the right occiput, and lateral to the left orbit. Soft tissue swelling extends over the left zygomatic arch. CT CERVICAL SPINE FINDINGS There is no evidence of fracture or subluxation. Vertebral bodies demonstrate normal height. Mild multilevel disc space narrowing is noted along the cervical spine, with scattered anterior and posterior disc osteophyte complexes. There is mild grade 1 anterolisthesis of C6 on C7. There is chronic osseous fusion of the facets on the right side at C3-C4. A 1.2 cm hypodensity with minimal calcification at the right thyroid lobe is likely benign, given its size. The visualized lung apices are clear. No significant soft tissue abnormalities are seen. IMPRESSION: 1. Trace subarachnoid hemorrhage overlying the central right frontal lobe. 2. Fracture through the right orbital floor, extending along the anterior wall of the right maxillary sinus, without evidence of herniation of intraorbital fat. Blood partially fills the right maxillary sinus. The right orbit is otherwise unremarkable in appearance. 3. Significantly comminuted fracture of the nasal bone, with overlying soft tissue swelling and disruption. 4. No evidence of fracture or subluxation along the cervical spine. 5. Mild soft tissue swelling at the right occiput, and lateral to the left orbit. Soft tissue swelling extends overlying the left zygomatic arch. 6. Mild degenerative change along the cervical spine. 7. Mild cortical volume loss and scattered small vessel ischemic microangiopathy. Critical Value/emergent results were called by telephone at the time of interpretation on 12/30/2015 at 3:07 am to Dr. Jaci Carrel, who verbally acknowledged these results. Electronically Signed   By: Roanna Raider M.D.   On: 12/30/2015 03:08   Ct Chest W  Contrast  12/30/2015  CLINICAL DATA:  Trauma, fall. Patient was in ED earlier with injuries to head and shoulder, he did not know what caused the injuries. Ran out of ED, followed to parking deck, went over a concrete wall onto ground, injury to nose. EXAM: CT CHEST, ABDOMEN, AND PELVIS WITH CONTRAST TECHNIQUE: Multidetector CT imaging of the chest, abdomen and pelvis was performed following the standard protocol during bolus administration of intravenous contrast. CONTRAST:  ISOVUE-300 IOPAMIDOL (ISOVUE-300) INJECTION 61% COMPARISON:  None. FINDINGS: CT CHEST FINDINGS No acute traumatic aortic injury. No mediastinal hematoma. No pleural or pericardial effusion. There is elevation of the left hemidiaphragm. Breathing motion artifact limits assessment. No pulmonary contusion. No pneumothorax or pneumomediastinum. The sternum is intact. Remote right rib fractures. No acute rib fracture. Included clavicle and shoulder girdles intact. Age-indeterminate mild compression deformity superior endplate of T5, suspect this is chronic. No soft tissue stranding of the chest wall. CT ABDOMEN AND PELVIS FINDINGS Breathing motion artifact through the upper abdomen. No acute traumatic injury to the liver, gallbladder, spleen, pancreas, kidneys, or adrenal glands. Gallbladder is distended, question  dependent gallstones versus motion. Bilateral renal cysts, largest in the left upper pole. The stomach is distended with ingested contents. There are no dilated or thickened bowel loops. There are prominent mesenteric lymph nodes adjacent to the descending colon. Additional prominent nodes scattered throughout the mesenteric. The adjacent colon is decompressed without gross evidence of focal mass. No mesenteric hematoma. No free air, free fluid, or intra-abdominal fluid collection. No retroperitoneal fluid. The IVC appears intact. No retroperitoneal adenopathy. Abdominal aorta is normal in caliber. Within the pelvis the bladder is  physiologically distended without wall thickening. No free fluid in the pelvis. Soft tissue stranding and rounded density in the left flank consistent with subcutaneous contusion. No active extravasation. Fat in the left inguinal canal is incidentally noted. Bony pelvis is intact without fracture. Lumbar spine is intact without fracture. IMPRESSION: 1. Subcutaneous soft tissue contusion to the left flank. 2. Age indeterminate compression deformity of T5, however suspect this is chronic. 3. No traumatic intra-thoracic or abdominal injury. Additional nontraumatic findings as described. Electronically Signed   By: Rubye Oaks M.D.   On: 12/30/2015 03:01   Ct Cervical Spine Wo Contrast  12/30/2015  CLINICAL DATA:  Injuries to left head and left shoulder of unknown etiology, and subsequently ran out of ER, went over a concrete wall and fell onto ground. Concern for cervical spine injury. Initial encounter. EXAM: CT HEAD WITHOUT CONTRAST CT CERVICAL SPINE WITHOUT CONTRAST TECHNIQUE: Multidetector CT imaging of the head and cervical spine was performed following the standard protocol without intravenous contrast. Multiplanar CT image reconstructions of the cervical spine were also generated. COMPARISON:  None. FINDINGS: CT HEAD FINDINGS There appears to be trace subarachnoid hemorrhage overlying the central right frontal lobe. There is no additional evidence of intra or extra-axial hemorrhage. No mass lesion or focal infarct is seen. Prominence of the sulci suggests mild cortical volume loss. Mild cerebellar atrophy is noted. Mild subcortical white matter change likely reflects small vessel ischemic microangiopathy. The brainstem and fourth ventricle are within normal limits. The basal ganglia are unremarkable in appearance. The cerebral hemispheres demonstrate grossly normal gray-white differentiation. No mass effect or midline shift is seen. There is a significantly comminuted fracture of the nasal bone, with  overlying soft tissue swelling and disruption. In addition, there appears to be a fracture through the right orbital floor, extending along the anterior wall of the right maxillary sinus, without evidence of herniation of intraorbital fat. Blood is noted partially filling the right maxillary sinus. No intraorbital air is seen; no intraorbital hemorrhage is identified. The left orbit is unremarkable in appearance. The remaining paranasal sinuses and mastoid air cells are well-aerated. Mild soft tissue swelling is noted at the right occiput, and lateral to the left orbit. Soft tissue swelling extends over the left zygomatic arch. CT CERVICAL SPINE FINDINGS There is no evidence of fracture or subluxation. Vertebral bodies demonstrate normal height. Mild multilevel disc space narrowing is noted along the cervical spine, with scattered anterior and posterior disc osteophyte complexes. There is mild grade 1 anterolisthesis of C6 on C7. There is chronic osseous fusion of the facets on the right side at C3-C4. A 1.2 cm hypodensity with minimal calcification at the right thyroid lobe is likely benign, given its size. The visualized lung apices are clear. No significant soft tissue abnormalities are seen. IMPRESSION: 1. Trace subarachnoid hemorrhage overlying the central right frontal lobe. 2. Fracture through the right orbital floor, extending along the anterior wall of the right maxillary sinus, without evidence of  herniation of intraorbital fat. Blood partially fills the right maxillary sinus. The right orbit is otherwise unremarkable in appearance. 3. Significantly comminuted fracture of the nasal bone, with overlying soft tissue swelling and disruption. 4. No evidence of fracture or subluxation along the cervical spine. 5. Mild soft tissue swelling at the right occiput, and lateral to the left orbit. Soft tissue swelling extends overlying the left zygomatic arch. 6. Mild degenerative change along the cervical spine. 7.  Mild cortical volume loss and scattered small vessel ischemic microangiopathy. Critical Value/emergent results were called by telephone at the time of interpretation on 12/30/2015 at 3:07 am to Dr. Jaci CarrelHRISTOPHER POLLINA, who verbally acknowledged these results. Electronically Signed   By: Roanna RaiderJeffery  Chang M.D.   On: 12/30/2015 03:08   Ct Abdomen Pelvis W Contrast  12/30/2015  CLINICAL DATA:  Trauma, fall. Patient was in ED earlier with injuries to head and shoulder, he did not know what caused the injuries. Ran out of ED, followed to parking deck, went over a concrete wall onto ground, injury to nose. EXAM: CT CHEST, ABDOMEN, AND PELVIS WITH CONTRAST TECHNIQUE: Multidetector CT imaging of the chest, abdomen and pelvis was performed following the standard protocol during bolus administration of intravenous contrast. CONTRAST:  100mL ISOVUE-300 IOPAMIDOL (ISOVUE-300) INJECTION 61% COMPARISON:  None. FINDINGS: CT CHEST FINDINGS No acute traumatic aortic injury. No mediastinal hematoma. No pleural or pericardial effusion. There is elevation of the left hemidiaphragm. Breathing motion artifact limits assessment. No pulmonary contusion. No pneumothorax or pneumomediastinum. The sternum is intact. Remote right rib fractures. No acute rib fracture. Included clavicle and shoulder girdles intact. Age-indeterminate mild compression deformity superior endplate of T5, suspect this is chronic. No soft tissue stranding of the chest wall. CT ABDOMEN AND PELVIS FINDINGS Breathing motion artifact through the upper abdomen. No acute traumatic injury to the liver, gallbladder, spleen, pancreas, kidneys, or adrenal glands. Gallbladder is distended, question dependent gallstones versus motion. Bilateral renal cysts, largest in the left upper pole. The stomach is distended with ingested contents. There are no dilated or thickened bowel loops. There are prominent mesenteric lymph nodes adjacent to the descending colon. Additional prominent  nodes scattered throughout the mesenteric. The adjacent colon is decompressed without gross evidence of focal mass. No mesenteric hematoma. No free air, free fluid, or intra-abdominal fluid collection. No retroperitoneal fluid. The IVC appears intact. No retroperitoneal adenopathy. Abdominal aorta is normal in caliber. Within the pelvis the bladder is physiologically distended without wall thickening. No free fluid in the pelvis. Soft tissue stranding and rounded density in the left flank consistent with subcutaneous contusion. No active extravasation. Fat in the left inguinal canal is incidentally noted. Bony pelvis is intact without fracture. Lumbar spine is intact without fracture. IMPRESSION: 1. Subcutaneous soft tissue contusion to the left flank. 2. Age indeterminate compression deformity of T5, however suspect this is chronic. 3. No traumatic intra-thoracic or abdominal injury. Additional nontraumatic findings as described. Electronically Signed   By: Rubye OaksMelanie  Ehinger M.D.   On: 12/30/2015 03:01   Dg Knee Complete 4 Views Left  12/30/2015  CLINICAL DATA:  Fall tonight, left knee pain. EXAM: LEFT KNEE - COMPLETE 4+ VIEW COMPARISON:  None. FINDINGS: No fracture or dislocation. Mild patellofemoral joint space narrowing and spurring. Small peripheral spurs in the medial compartment with preservation of joint space. No joint effusion. IMPRESSION: No fracture or dislocation of the left knee. Electronically Signed   By: Rubye OaksMelanie  Ehinger M.D.   On: 12/30/2015 00:48     Anti-infectives:  Anti-infectives    Start     Dose/Rate Route Frequency Ordered Stop   12/30/15 0600  ceFAZolin (ANCEF) IVPB 1 g/50 mL premix     1 g 100 mL/hr over 30 Minutes Intravenous Every 8 hours 12/30/15 0446     12/30/15 0130  ceFAZolin (ANCEF) IVPB 1 g/50 mL premix     1 g 100 mL/hr over 30 Minutes Intravenous  Once 12/30/15 0128 12/30/15 0446      Ovidio Kin, MD, FACS Pager: [phone] [facility]  Office: [phone] 12/31/2015

## 2016-01-02 LAB — GLUCOSE, CAPILLARY
GLUCOSE-CAPILLARY: 158 mg/dL — AB (ref 65–99)
Glucose-Capillary: 114 mg/dL — ABNORMAL HIGH (ref 65–99)

## 2019-09-15 ENCOUNTER — Emergency Department: Payer: Self-pay

## 2019-09-15 ENCOUNTER — Emergency Department
Admission: EM | Admit: 2019-09-15 | Discharge: 2019-09-15 | Disposition: A | Discharge status: Home / Self Care | Payer: Self-pay | Attending: Emergency Medicine | Admitting: Emergency Medicine

## 2019-09-15 ENCOUNTER — Other Ambulatory Visit: Payer: Self-pay

## 2019-09-15 DIAGNOSIS — I10 Essential (primary) hypertension: Secondary | ICD-10-CM | POA: Insufficient documentation

## 2019-09-15 DIAGNOSIS — R519 Headache, unspecified: Secondary | ICD-10-CM | POA: Insufficient documentation

## 2019-09-15 DIAGNOSIS — F17200 Nicotine dependence, unspecified, uncomplicated: Secondary | ICD-10-CM | POA: Insufficient documentation

## 2019-09-15 DIAGNOSIS — R05 Cough: Secondary | ICD-10-CM | POA: Insufficient documentation

## 2019-09-15 DIAGNOSIS — R42 Dizziness and giddiness: Secondary | ICD-10-CM | POA: Insufficient documentation

## 2019-09-15 DIAGNOSIS — E119 Type 2 diabetes mellitus without complications: Secondary | ICD-10-CM | POA: Insufficient documentation

## 2019-09-15 DIAGNOSIS — Z7984 Long term (current) use of oral hypoglycemic drugs: Secondary | ICD-10-CM | POA: Insufficient documentation

## 2019-09-15 DIAGNOSIS — R0602 Shortness of breath: Secondary | ICD-10-CM | POA: Insufficient documentation

## 2019-09-15 LAB — CBC WITH DIFFERENTIAL/PLATELET
Abs Immature Granulocytes: 0.05 10*3/uL (ref 0.00–0.07)
Basophils Absolute: 0.1 10*3/uL (ref 0.0–0.1)
Basophils Relative: 1 %
Eosinophils Absolute: 0 10*3/uL (ref 0.0–0.5)
Eosinophils Relative: 0 %
HCT: 44.2 % (ref 39.0–52.0)
Hemoglobin: 15.3 g/dL (ref 13.0–17.0)
Immature Granulocytes: 1 %
Lymphocytes Relative: 15 %
Lymphs Abs: 1.7 10*3/uL (ref 0.7–4.0)
MCH: 33.7 pg (ref 26.0–34.0)
MCHC: 34.6 g/dL (ref 30.0–36.0)
MCV: 97.4 fL (ref 80.0–100.0)
Monocytes Absolute: 0.7 10*3/uL (ref 0.1–1.0)
Monocytes Relative: 7 %
Neutro Abs: 8.2 10*3/uL — ABNORMAL HIGH (ref 1.7–7.7)
Neutrophils Relative %: 76 %
Platelets: 301 10*3/uL (ref 150–400)
RBC: 4.54 MIL/uL (ref 4.22–5.81)
RDW: 12.3 % (ref 11.5–15.5)
WBC: 10.8 10*3/uL — ABNORMAL HIGH (ref 4.0–10.5)
nRBC: 0 % (ref 0.0–0.2)

## 2019-09-15 LAB — COMPREHENSIVE METABOLIC PANEL
ALT: 64 U/L — ABNORMAL HIGH (ref 0–44)
AST: 56 U/L — ABNORMAL HIGH (ref 15–41)
Albumin: 4.4 g/dL (ref 3.5–5.0)
Alkaline Phosphatase: 97 U/L (ref 38–126)
Anion gap: 13 (ref 5–15)
BUN: 11 mg/dL (ref 6–20)
CO2: 24 mmol/L (ref 22–32)
Calcium: 11.1 mg/dL — ABNORMAL HIGH (ref 8.9–10.3)
Chloride: 96 mmol/L — ABNORMAL LOW (ref 98–111)
Creatinine, Ser: 0.82 mg/dL (ref 0.61–1.24)
GFR calc Af Amer: >60 mL/min (ref >60)
GFR calc non Af Amer: >60 mL/min (ref >60)
Glucose, Bld: 206 mg/dL — ABNORMAL HIGH (ref 70–99)
Potassium: 4.3 mmol/L (ref 3.5–5.1)
Sodium: 133 mmol/L — ABNORMAL LOW (ref 135–145)
Total Bilirubin: 1.5 mg/dL — ABNORMAL HIGH (ref 0.3–1.2)
Total Protein: 7.9 g/dL (ref 6.5–8.1)

## 2019-09-15 LAB — TROPONIN I (HIGH SENSITIVITY)
Troponin I (High Sensitivity): 15 ng/L (ref <18)
Troponin I (High Sensitivity): 14 ng/L (ref <18)

## 2019-09-15 LAB — ETHANOL: Alcohol, Ethyl (B): <10 mg/dL (ref <10)

## 2019-09-15 LAB — LIPASE, BLOOD: Lipase: 24 U/L (ref 11–51)

## 2019-09-15 MED ORDER — AMLODIPINE BESYLATE 10 MG PO TABS: 10.0000 mg | ORAL_TABLET | Freq: Every day | ORAL | 1 refills | Status: DC

## 2019-09-15 MED ORDER — HYDROCHLOROTHIAZIDE 25 MG PO TABS: 25.0000 mg | ORAL_TABLET | Freq: Every day | ORAL | 0 refills | Status: AC

## 2019-09-15 MED ORDER — LABETALOL HCL 5 MG/ML IV SOLN
20.0000 mg | Freq: Once | INTRAVENOUS | Status: AC
  Administered 2019-09-15: 20 mg via INTRAVENOUS
  Filled 2019-09-15: qty 4

## 2019-09-15 MED ORDER — FAMOTIDINE 20 MG PO TABS: 20.0000 mg | ORAL_TABLET | Freq: Two times a day (BID) | ORAL | 0 refills | Status: AC

## 2019-09-15 MED ORDER — AMLODIPINE BESYLATE 5 MG PO TABS
10.0000 mg | ORAL_TABLET | Freq: Once | ORAL | Status: AC
  Administered 2019-09-15: 10 mg via ORAL
  Filled 2019-09-15: qty 2

## 2019-09-15 NOTE — ED Notes (Signed)
Explained medication management and [facility] for care.  He says he also has been having trouble swollowing.  He was able to take the pills.  Dr Scotty Court came and looked at th roat.

## 2019-09-15 NOTE — ED Notes (Signed)
labetolol given.  Patient hr in upper 50s sinus throughout admin.  He says he is still feeling dizzy and ceiling looks funny.  He sounds congested at times and then clears with cough.

## 2019-09-15 NOTE — ED Provider Notes (Signed)
[facility] Emergency Department Provider Note  ____________________________________________  Time seen: Approximately 8:22 AM  I have reviewed the triage vital signs and the nursing notes.   HISTORY  Chief Complaint Hypertension and Headache    Level 5 Caveat: Portions of the History and Physical including HPI and review of systems are unable to be completely obtained due to patient being a poor historian   HPI Jeffrey Solomon is a 59 y.o. male with a past history of diabetes and hypertension who comes the ED complaining of generalized headache and dizziness described as room spinning.  This started gradually 2 to 3 days ago.  Not thunderclap.  No vision loss or change in peripheral vision, no blurry vision or double vision.  No paresthesias or weakness.  Also reports gradual onset of shortness of breath and nonproductive cough.  No aggravating or alleviating factors.  Denies chest pain fevers chills body aches.  Reportedly he has not been on any blood pressure medicine in the last 4 years.   The last time he saw any healthcare providers was in July 2017 when he had a trauma admission.  At that time he was discharged on chlorthalidone only.  Patient reports he has not been taking any medication at all.     Past Medical History:  Diagnosis Date  . Diabetes mellitus without complication ([place])   . Hypertension      Patient Active Problem List   Diagnosis Date Noted  . SAH (subarachnoid hemorrhage) ([place]) 12/30/2015     No past surgical history on file.   Prior to Admission medications   Medication Sig Start Date End Date Taking? Authorizing Provider  amLODipine (NORVASC) 10 MG tablet Take 1 tablet (10 mg total) by mouth daily. 09/15/19 11/14/19  Carrie Mew, MD  hydrochlorothiazide (HYDRODIURIL) 25 MG tablet Take 1 tablet (25 mg total) by mouth daily. 09/15/19 10/15/19  Carrie Mew, MD  chlorthalidone (HYGROTON) 25 MG tablet Take 1 tablet  (25 mg total) by mouth daily. 12/31/15 09/15/19  Alphonsa Overall, MD     Allergies Patient has no known allergies.   No family history on file.  Social History Social History   Tobacco Use  . Smoking status: Current Every Day Smoker  Substance Use Topics  . Alcohol use: Yes  . Drug use: Not on file    Review of Systems Level 5 Caveat: Portions of the History and Physical including HPI and review of systems are unable to be completely obtained due to patient being a poor historian   Constitutional:   No known fever.  ENT:   No rhinorrhea. Cardiovascular:   No chest pain or syncope. Respiratory:   Positive shortness of breath and nonproductive cough. Gastrointestinal:   Negative for abdominal pain, vomiting and diarrhea.  Musculoskeletal:   Negative for focal pain or swelling ____________________________________________   PHYSICAL EXAM:  VITAL SIGNS: ED Triage Vitals  Enc Vitals Group     BP 09/15/19 0526 (!) 222/131     Pulse Rate 09/15/19 0526 64     Resp 09/15/19 0526 20     Temp --      Temp src --      SpO2 09/15/19 0526 100 %     Weight 09/15/19 0528 240 lb (108.9 kg)     Height 09/15/19 0528 5\' 11"  (1.803 m)     Head Circumference --      Peak Flow --      Pain Score 09/15/19 0527 7  Pain Loc --      Pain Edu? --      Excl. in GC? --     Vital signs reviewed, nursing assessments reviewed.   Constitutional:   Alert and oriented. Non-toxic appearance.  Obese Eyes:   Conjunctivae are normal. EOMI. PERRL. ENT      Head:   Normocephalic and atraumatic.      Nose:   No congestion/rhinnorhea.       Mouth/Throat:   MMM, no pharyngeal erythema. No peritonsillar mass.       Neck:   No meningismus. Full ROM. Hematological/Lymphatic/Immunilogical:   No cervical lymphadenopathy. Cardiovascular:   RRR. Symmetric bilateral radial and DP pulses.  No murmurs. Cap refill less than 2 seconds. Respiratory:   Normal respiratory effort without tachypnea/retractions.   Bilateral basilar crackles.  Expiratory wheezing. Gastrointestinal:   Soft and nontender. Non distended. There is no CVA tenderness.  No rebound, rigidity, or guarding. Musculoskeletal:   Normal range of motion in all extremities. No joint effusions.  No lower extremity tenderness.  No edema. Neurologic:   Normal speech and language.  Motor grossly intact. No acute focal neurologic deficits are appreciated.  Skin:    Skin is warm, dry and intact. No rash noted.  No petechiae, purpura, or bullae.  ____________________________________________    LABS (pertinent positives/negatives) (all labs ordered are listed, but only abnormal results are displayed) Labs Reviewed  CBC WITH DIFFERENTIAL/PLATELET - Abnormal; Notable for the following components:      Result Value   WBC 10.8 (*)    Neutro Abs 8.2 (*)    All other components within normal limits  COMPREHENSIVE METABOLIC PANEL - Abnormal; Notable for the following components:   Sodium 133 (*)    Chloride 96 (*)    Glucose, Bld 206 (*)    Calcium 11.1 (*)    AST 56 (*)    ALT 64 (*)    Total Bilirubin 1.5 (*)    All other components within normal limits  ETHANOL  LIPASE, BLOOD  URINE DRUG SCREEN, QUALITATIVE ([facility] ONLY)  TROPONIN I (HIGH SENSITIVITY)  TROPONIN I (HIGH SENSITIVITY)   ____________________________________________   EKG  Interpreted by me Normal sinus rhythm rate of 64, normal axis and intervals.  Normal QRS ST segments.  Slight T wave inversions in anterolateral leads, nonischemic in appearance, new compared to prior EKGs in 2017.  ____________________________________________    RADIOLOGY  DG Chest 1 View  Result Date: 09/15/2019 CLINICAL DATA:  Wheezing and shortness of breath. Abnormal breath sounds. EXAM: CHEST  1 VIEW COMPARISON:  CT 12/30/2015. FINDINGS: Mediastinum hilar structures normal. Heart size normal. Low lung volumes mild bibasilar atelectasis/infiltrates. No pleural effusion or pneumothorax.  Stable elevation left hemidiaphragm. Gastric distention noted. IMPRESSION: 1.  Low lung volumes with mild bibasilar atelectasis/infiltrates. 2.  Stable elevation left hemidiaphragm.  Gastric distention noted. Electronically Signed   By: Maisie Fus  Register   On: 09/15/2019 08:01   CT Head Wo Contrast  Result Date: 09/15/2019 CLINICAL DATA:  Acute headache with normal neuro exam. Malignant hypertension. Concern for press EXAM: CT HEAD WITHOUT CONTRAST TECHNIQUE: Contiguous axial images were obtained from the base of the skull through the vertex without intravenous contrast. COMPARISON:  12/30/2015 FINDINGS: Brain: No evidence of acute infarction, hemorrhage, hydrocephalus, extra-axial collection or mass lesion/mass effect. No evidence of vasogenic edema. Vague high-density along the superficial left temporal lobe is attributed to streak artifact from the skull. Vascular: No hyperdense vessel or unexpected calcification. Intracranial vessel tortuosity. Skull:  Normal. Negative for fracture or focal lesion. Sinuses/Orbits: Gaze to the left, nonspecific in this setting. IMPRESSION: No emergent finding. Electronically Signed   By: Marnee Spring M.D.   On: 09/15/2019 05:52    ____________________________________________   PROCEDURES .Critical Care Performed by: Sharman Cheek, MD Authorized by: Sharman Cheek, MD   Critical care provider statement:    Critical care time (minutes):  33   Critical care time was exclusive of:  Separately billable procedures and treating other patients   Critical care was necessary to treat or prevent imminent or life-threatening deterioration of the following conditions:  CNS failure or compromise and circulatory failure   Critical care was time spent personally by me on the following activities:  Development of treatment plan with patient or surrogate, discussions with consultants, evaluation of patient's response to treatment, examination of patient, obtaining  history from patient or surrogate, ordering and performing treatments and interventions, ordering and review of laboratory studies, ordering and review of radiographic studies, pulse oximetry, re-evaluation of patient's condition and review of old charts    ____________________________________________  DIFFERENTIAL DIAGNOSIS   Symptomatic hypertension, pulmonary edema, pleural effusion, COPD exacerbation  CLINICAL IMPRESSION / ASSESSMENT AND PLAN / ED COURSE  Medications ordered in the ED: Medications  amLODipine (NORVASC) tablet 10 mg (has no administration in time range)  labetalol (NORMODYNE) injection 20 mg (20 mg Intravenous Given 09/15/19 0540)  labetalol (NORMODYNE) injection 20 mg (20 mg Intravenous Given 09/15/19 0730)    Pertinent labs & imaging results that were available during my care of the patient were reviewed by me and considered in my medical decision making (see chart for details).   Nykolas Bacallao [facility] was evaluated in Emergency Department on 09/15/2019 for the symptoms described in the history of present illness. He was evaluated in the context of the global COVID-19 pandemic, which necessitated consideration that the patient might be at risk for infection with the SARS-CoV-2 virus that causes COVID-19. Institutional protocols and algorithms that pertain to the evaluation of patients at risk for COVID-19 are in a state of rapid change based on information released by regulatory bodies including the CDC and federal and state organizations. These policies and algorithms were followed during the patient's care in the ED.   Patient presents with headache and dizziness, found to have severely elevated blood pressure due to medication noncompliance.  No signs of trauma.  No significant chest pain or shortness of breath.  EKG shows some T wave inversions which are new compared to 2017, unclear if this is acute due to effect of his blood pressure or more chronic.  No ischemic symptoms  or signs.  Serial troponins are negative.  After labetalol blood pressure is 160/110, suitable for discharge on oral Norvasc and HCTZ, recommend follow-up with primary care in a week for blood pressure recheck, lab recheck as needed.   Considering the patient's symptoms, medical history, and physical examination today, I have low suspicion for ischemic stroke, intracranial hemorrhage, meningitis, encephalitis, carotid or vertebral dissection, venous sinus thrombosis, MS, intracranial hypertension, glaucoma, CRAO, CRVO, or temporal arteritis.       ____________________________________________   FINAL CLINICAL IMPRESSION(S) / ED DIAGNOSES    Final diagnoses:  Hypertension, unspecified type     ED Discharge Orders         Ordered    amLODipine (NORVASC) 10 MG tablet  Daily     09/15/19 0926    hydrochlorothiazide (HYDRODIURIL) 25 MG tablet  Daily     09/15/19 0926  Portions of this note were generated with dragon dictation software. Dictation errors may occur despite best attempts at proofreading.   Sharman Cheek, MD 09/15/19 [phone]

## 2019-09-15 NOTE — ED Triage Notes (Signed)
PT to ED via EMS from home c/o elevated BP with dizziness and headache. PT noncompliant with BP medications. PT AO upon arrival.

## 2019-09-15 NOTE — ED Notes (Signed)
Says he does not have any pain, but he is exhausted and did not sleep all night .  He has now a towel over his eyes and will try to sleep.

## 2019-09-15 NOTE — Discharge Instructions (Addendum)
Your symptoms today appear to be due to uncontrolled high blood pressure. Your tests today were all okay. Please take amlodipine 10mg  once a day and HCTZ (hydrochlorothiazide) 25mg  once a day to control your blood pressure. You should follow up with primary care in 1 week for blood pressure recheck.

## 2019-09-16 DIAGNOSIS — I639 Cerebral infarction, unspecified: Secondary | ICD-10-CM | POA: Insufficient documentation

## 2019-09-16 DIAGNOSIS — G4733 Obstructive sleep apnea (adult) (pediatric): Secondary | ICD-10-CM | POA: Insufficient documentation

## 2019-09-28 DIAGNOSIS — E213 Hyperparathyroidism, unspecified: Secondary | ICD-10-CM | POA: Insufficient documentation

## 2019-10-27 ENCOUNTER — Telehealth: Payer: Self-pay | Admitting: Pharmacist

## 2019-10-27 NOTE — Telephone Encounter (Signed)
Patient failed to provide requested 2021 financial documentation. No additional medication assistance will be provided by [facility] without the required proof of income documentation. Patient notified by letter. Debra Cheek Administrative Assistant [facility] 

## 2020-02-26 DIAGNOSIS — D509 Iron deficiency anemia, unspecified: Secondary | ICD-10-CM | POA: Insufficient documentation

## 2020-02-26 DIAGNOSIS — E669 Obesity, unspecified: Secondary | ICD-10-CM | POA: Insufficient documentation

## 2020-02-26 DIAGNOSIS — E785 Hyperlipidemia, unspecified: Secondary | ICD-10-CM | POA: Insufficient documentation

## 2020-06-10 ENCOUNTER — Telehealth (HOSPITAL_COMMUNITY): Payer: Self-pay

## 2020-06-10 NOTE — Telephone Encounter (Signed)
Called to Discuss with patient about Covid symptoms and the use of the monoclonal antibody infusion for those with mild to moderate Covid symptoms and at a high risk of hospitalization.     Pt appears to qualify for this infusion due to co-morbid conditions and/or a member of an at-risk group in accordance with the FDA Emergency Use Authorization.    Unable to reach patient and left voicemail message to return call at [phone].

## 2020-09-29 DIAGNOSIS — E559 Vitamin D deficiency, unspecified: Secondary | ICD-10-CM | POA: Insufficient documentation

## 2020-09-29 DIAGNOSIS — E042 Nontoxic multinodular goiter: Secondary | ICD-10-CM | POA: Insufficient documentation

## 2020-11-08 IMAGING — CT CT HEAD W/O CM
3 series · 15 of 47 positions shown, 18 images · non-contrast
Comparison: 12/30/2015

CLINICAL DATA: Acute headache with normal neuro exam. Malignant
hypertension. Concern for press

EXAM:
CT HEAD WITHOUT CONTRAST
TECHNIQUE: Contiguous axial images were obtained from the base of the skull
through the vertex without intravenous contrast.

[Series 2: head wo · axial · 0.43mm/px · z∈[-101,+24]mm · 9 of 30 slices shown, 12 images]
[im 3/30  brain]
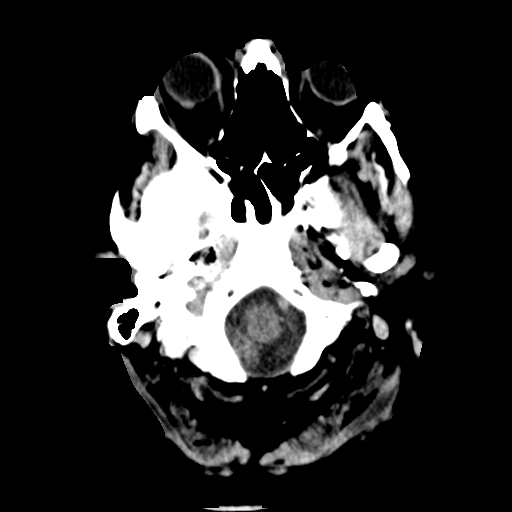
[im 3/30  bone]
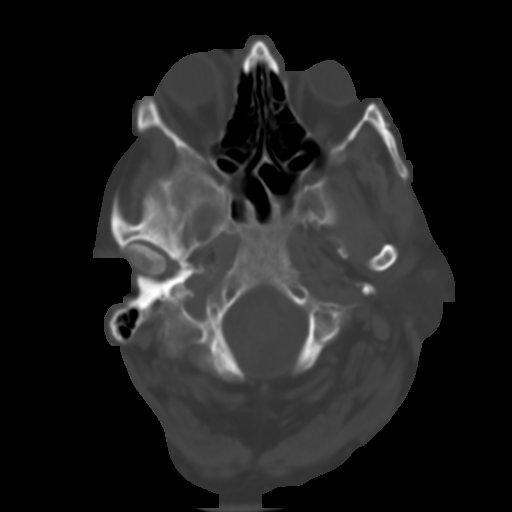
[im 6/30  brain]
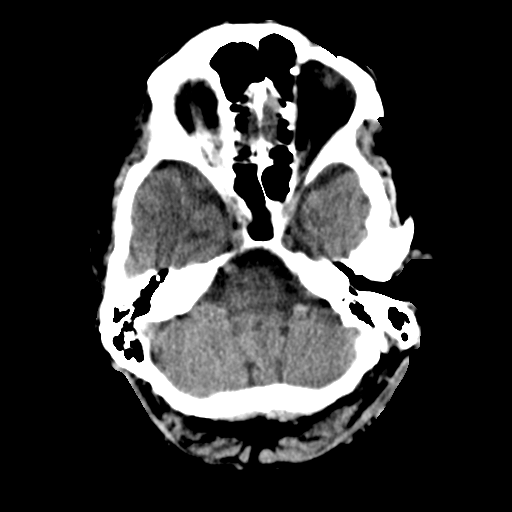
[im 9/30  brain]
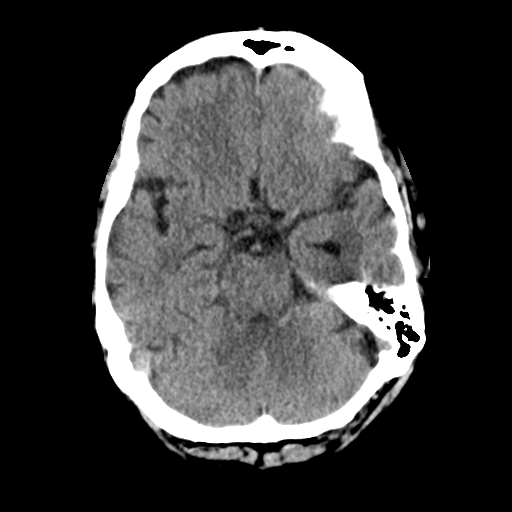
[im 12/30  brain]
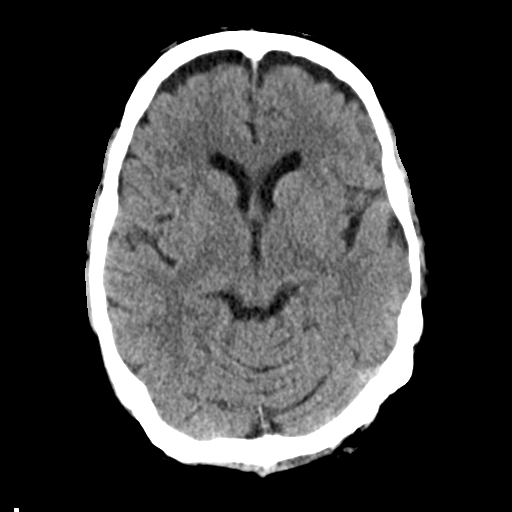
[im 16/30  brain]
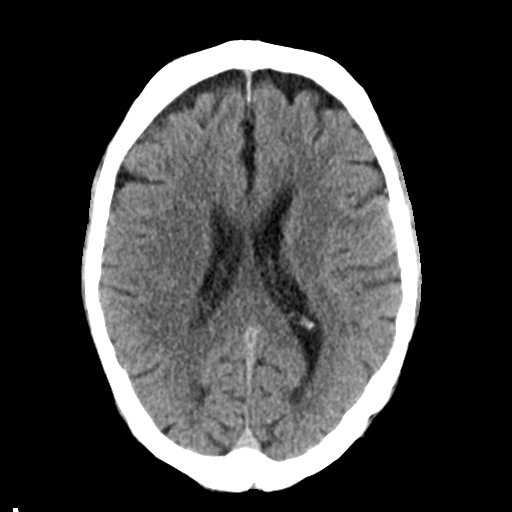
[im 16/30  bone]
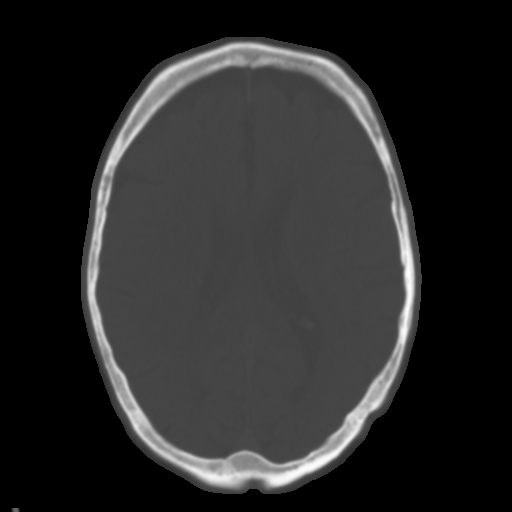
[im 19/30  brain]
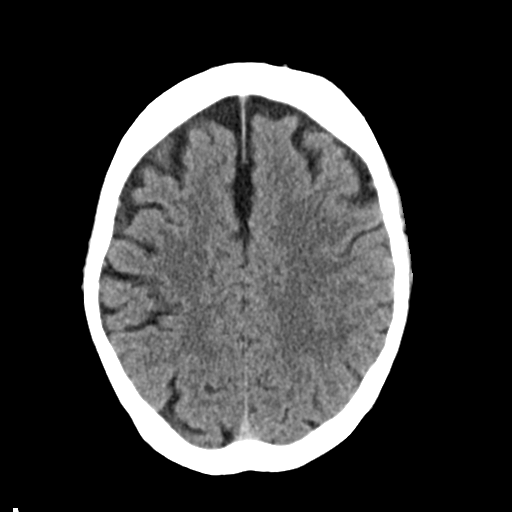
[im 22/30  brain]
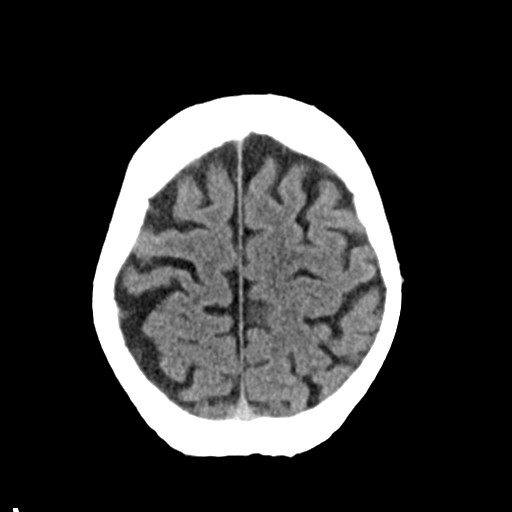
[im 25/30  brain]
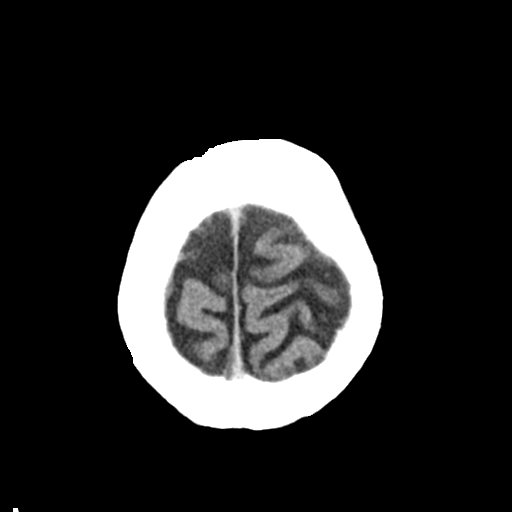
[im 28/30  brain]
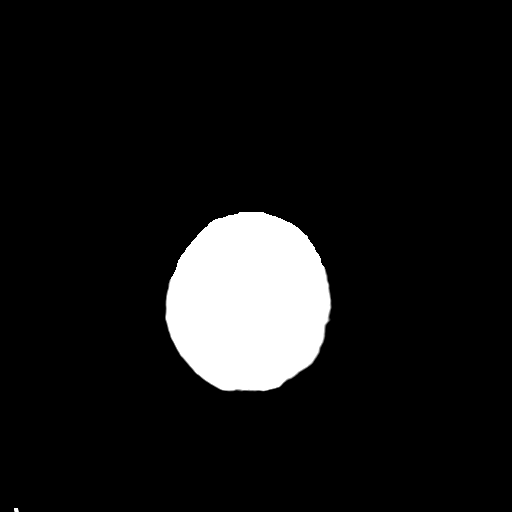
[im 28/30  bone]
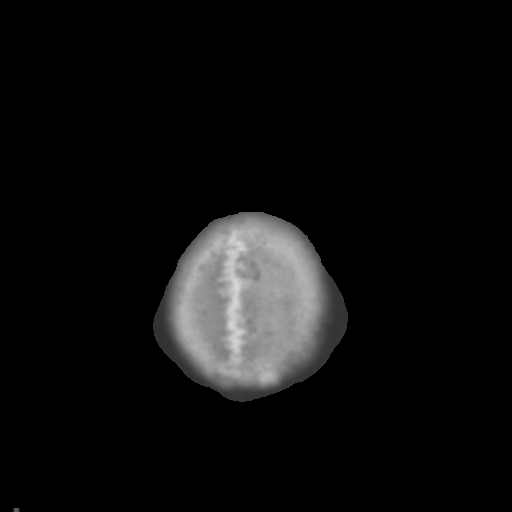

[Series 4: coronal soft tissue · coronal · 0.30mm/px · 3 of 65 slices shown]
[im 22/65  brain]
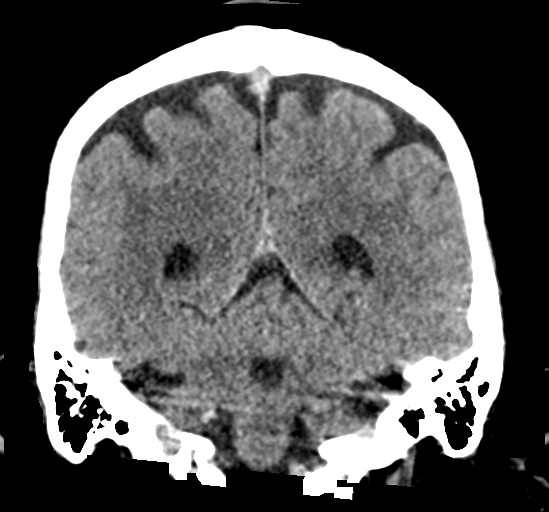
[im 29/65  brain]
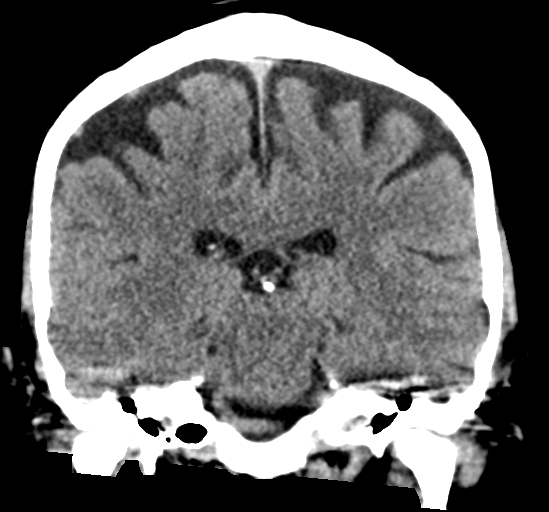
[im 36/65  brain]
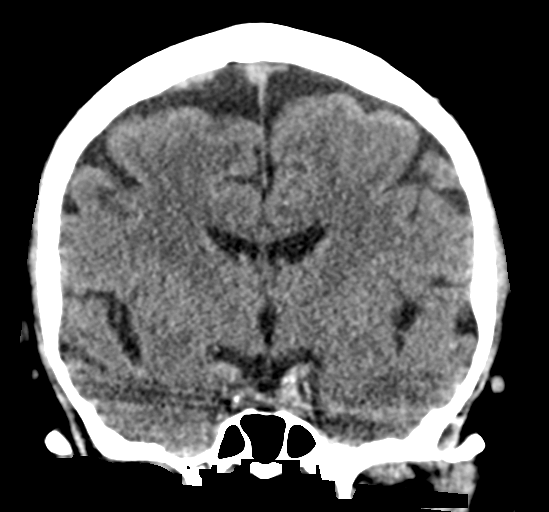

[Series 5: sagittal soft tissue · sagittal · 0.30mm/px · 3 of 48 slices shown]
[im 16/48  brain]
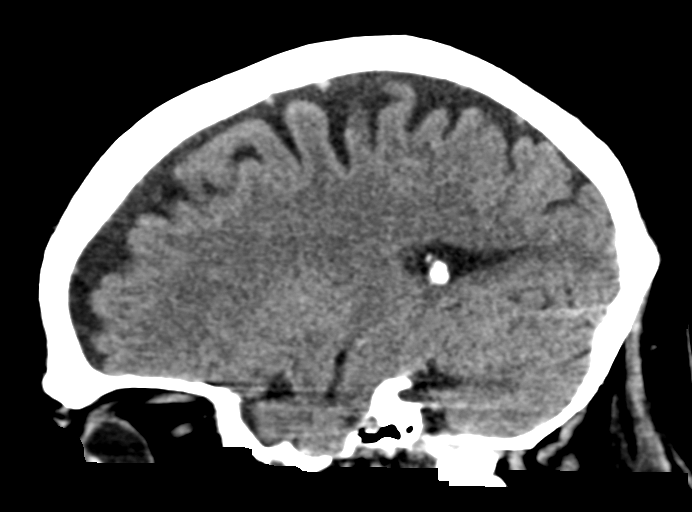
[im 24/48  brain]
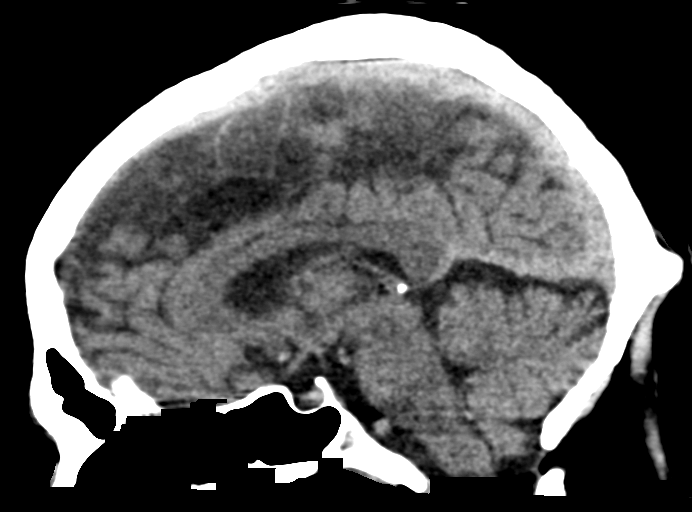
[im 32/48  brain]
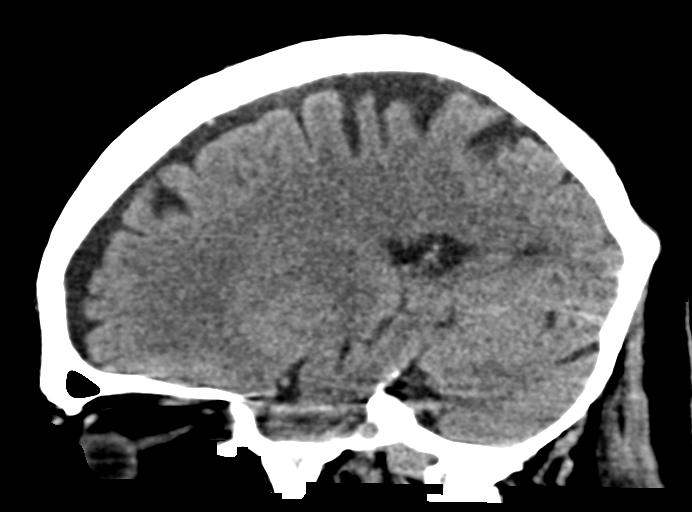

[15 of 47 positions shown; findings below may reference images not displayed]

FINDINGS: Brain: No evidence of acute infarction, hemorrhage, hydrocephalus,
extra-axial collection or mass lesion/mass effect. No evidence of
vasogenic edema. Vague high-density along the superficial left
temporal lobe is attributed to streak artifact from the skull.

Vascular: No hyperdense vessel or unexpected calcification.
Intracranial vessel tortuosity.

Skull: Normal. Negative for fracture or focal lesion.

Sinuses/Orbits: Gaze to the left, nonspecific in this setting.
IMPRESSION: No emergent finding.

## 2021-05-25 DIAGNOSIS — Z0000 Encounter for general adult medical examination without abnormal findings: Secondary | ICD-10-CM | POA: Insufficient documentation

## 2021-05-25 DIAGNOSIS — C73 Malignant neoplasm of thyroid gland: Secondary | ICD-10-CM | POA: Insufficient documentation

## 2021-05-25 DIAGNOSIS — R7309 Other abnormal glucose: Secondary | ICD-10-CM | POA: Insufficient documentation

## 2021-06-22 DIAGNOSIS — E118 Type 2 diabetes mellitus with unspecified complications: Secondary | ICD-10-CM | POA: Insufficient documentation

## 2022-02-21 DIAGNOSIS — K5190 Ulcerative colitis, unspecified, without complications: Secondary | ICD-10-CM | POA: Insufficient documentation

## 2023-08-19 ENCOUNTER — Telehealth: Payer: Self-pay | Admitting: Family Medicine

## 2023-08-19 ENCOUNTER — Encounter: Payer: Self-pay | Admitting: Family Medicine

## 2023-08-19 ENCOUNTER — Ambulatory Visit: Payer: Medicaid Other | Admitting: Family Medicine

## 2023-08-19 VITALS — BP 125/86 | HR 73 | Temp 97.6°F | Resp 18 | Ht 70.5 in | Wt 246.0 lb

## 2023-08-19 DIAGNOSIS — E559 Vitamin D deficiency, unspecified: Secondary | ICD-10-CM

## 2023-08-19 DIAGNOSIS — E210 Primary hyperparathyroidism: Secondary | ICD-10-CM

## 2023-08-19 DIAGNOSIS — E213 Hyperparathyroidism, unspecified: Secondary | ICD-10-CM

## 2023-08-19 DIAGNOSIS — M5136 Other intervertebral disc degeneration, lumbar region with discogenic back pain only: Secondary | ICD-10-CM

## 2023-08-19 DIAGNOSIS — E782 Mixed hyperlipidemia: Secondary | ICD-10-CM

## 2023-08-19 DIAGNOSIS — E118 Type 2 diabetes mellitus with unspecified complications: Secondary | ICD-10-CM | POA: Diagnosis not present

## 2023-08-19 DIAGNOSIS — K5010 Crohn's disease of large intestine without complications: Secondary | ICD-10-CM

## 2023-08-19 DIAGNOSIS — I10 Essential (primary) hypertension: Secondary | ICD-10-CM

## 2023-08-19 LAB — POCT GLYCOSYLATED HEMOGLOBIN (HGB A1C): Hemoglobin A1C: 10.8 % — AB (ref 4.0–5.6)

## 2023-08-19 MED ORDER — DAPAGLIFLOZIN PROPANEDIOL 10 MG PO TABS: 10.0000 mg | ORAL_TABLET | Freq: Every day | ORAL | 3 refills | Status: DC

## 2023-08-19 MED ORDER — GLIPIZIDE 10 MG PO TABS: 10.0000 mg | ORAL_TABLET | Freq: Two times a day (BID) | ORAL | 3 refills | Status: DC

## 2023-08-19 MED ORDER — GLIPIZIDE 5 MG PO TABS: 5.0000 mg | ORAL_TABLET | Freq: Two times a day (BID) | ORAL | 3 refills | Status: DC

## 2023-08-19 NOTE — Assessment & Plan Note (Addendum)
 Very poorly controlled with an A1c of 10.8.  Patient not taking any medication for diabetes.  Patient is adverse to taking metformin.  Restart glipizide 5 mg twice daily and start Farxiga 10 mg daily.  Advised he is going to need to drink water because going to have a high urine output.  Follow-up in 1 months.  Encouraged a daily walk.

## 2023-08-19 NOTE — Assessment & Plan Note (Signed)
 Will check vitamin D level.  Takes vitamin D3 5000 IU daily

## 2023-08-19 NOTE — Assessment & Plan Note (Signed)
 On atorvastatin 80 mg daily.  Will check lipid profile and ASCVD.

## 2023-08-19 NOTE — Progress Notes (Unsigned)
 Established Patient Office Visit  Subjective   Patient ID: Jeffrey Solomon, male    DOB: Mar 26, 1961  Age: 63 y.o. MRN: [id number]  Chief Complaint  Patient presents with   Establish Care    HPI Delightful 63 year old male with hypertension, DMT2, Crohn's disease, history of subdural hematoma, sleep apnea (s/p UUUplasty) mixed hyperlipidemia, peripheral neuropathy, on iron, vitamin D deficiency history of thyroid cancer and hyperparathyroidism. A1c 10.8 today.  Taking no diabetes medications.  Had been taking glipizide 5 mg once daily and metformin 500 mg daily.  Quit metformin because of the gastric side effects and asked not to take that again.  Does take Lyrica 150 mg 3 times daily for his peripheral neuropathy also takes B12 but has never been told he had a B12 deficiency.  Does take an 81 mg aspirin daily.  Takes his atorvastatin 80 mg for his hyperlipidemia takes his cyclobenzaprine 5 mg daily for his back. Has never been a smoker quit drinking alcohol November 3 2 years ago Got a colonoscopy February 3 years ago had 2 polyps does not know what his follow-up is. Initial check of his blood pressure 144/90 but repeat after resting 125/86.   {History (Optional):23778}  ROS    Objective:     BP 125/86 (BP Location: Right Arm, Patient Position: Sitting, Cuff Size: Large)   Pulse 73   Temp 97.6 F (36.4 C) (Oral)   Resp 18   Ht 5' 10.5" (1.791 m) Comment: per patient  Wt 246 lb (111.6 kg)   SpO2 91%   BMI 34.80 kg/m  {Vitals History (Optional):23777}  Physical Exam Vitals and nursing note reviewed.  Constitutional:      Appearance: Normal appearance.  HENT:     Head: Normocephalic and atraumatic.  Eyes:     Conjunctiva/sclera: Conjunctivae normal.  Cardiovascular:     Rate and Rhythm: Normal rate and regular rhythm.     Pulses:          Dorsalis pedis pulses are 2+ on the right side and 2+ on the left side.       Posterior tibial pulses are 2+ on the right side  and 2+ on the left side.  Pulmonary:     Effort: Pulmonary effort is normal.     Breath sounds: Normal breath sounds.  Musculoskeletal:     Right lower leg: No edema.     Left lower leg: No edema.  Feet:     Right foot:     Protective Sensation: 4 sites tested.  4 sites sensed.     Skin integrity: Skin integrity normal.     Toenail Condition: Right toenails are normal.     Left foot:     Protective Sensation: 4 sites tested.  4 sites sensed.     Skin integrity: Skin integrity normal.     Toenail Condition: Left toenails are normal.     Comments: Hemosiderosis of skin of both feet Skin:    General: Skin is warm and dry.  Neurological:     Mental Status: He is alert and oriented to person, place, and time.  Psychiatric:        Mood and Affect: Mood normal.        Behavior: Behavior normal.        Thought Content: Thought content normal.        Judgment: Judgment normal.      Results for orders placed or performed in visit on 08/19/23  POCT HgB A1C  Result Value Ref Range   Hemoglobin A1C 10.8 (A) 4.0 - 5.6 %   HbA1c POC (<> result, manual entry)     HbA1c, POC (prediabetic range)     HbA1c, POC (controlled diabetic range)      {Labs (Optional):23779}  The ASCVD Risk score (Arnett DK, et al., 2019) failed to calculate for the following reasons:   Risk score cannot be calculated because patient has a medical history suggesting prior/existing ASCVD    Assessment & Plan:   Problem List Items Addressed This Visit       Endocrine   Hyperparathyroidism (HCC)   Will check his bone mineral density as this is a secondary cause of osteoporosis.      Type 2 diabetes with complication (HCC) - Primary   Relevant Medications   aspirin 81 MG chewable tablet   atorvastatin (LIPITOR) 80 MG tablet   metFORMIN (GLUCOPHAGE) 500 MG tablet   dapagliflozin propanediol (FARXIGA) 10 MG TABS tablet   glipiZIDE (GLUCOTROL) 5 MG tablet   Other Relevant Orders   POCT HgB A1C  (Completed)   HM Diabetes Foot Exam (Completed)   Ambulatory referral to Ophthalmology   POCT UA - Microalbumin   CMP14+EGFR   Lipid panel     Musculoskeletal and Integument   Degeneration of lumbar intervertebral disc   Take cyclobenzaprine 5 mg twice daily.      Relevant Medications   aspirin 81 MG chewable tablet   cyclobenzaprine (FLEXERIL) 5 MG tablet     Other   Hyperlipidemia   On atorvastatin 80 mg daily.  Will check lipid profile and ASCVD.      Relevant Medications   aspirin 81 MG chewable tablet   atorvastatin (LIPITOR) 80 MG tablet   metoprolol succinate (TOPROL-XL) 50 MG 24 hr tablet   amLODipine (NORVASC) 10 MG tablet   Vitamin D deficiency   Relevant Orders   Vitamin D (25 hydroxy)   Other Visit Diagnoses       Primary hyperparathyroidism (HCC)       Relevant Orders   DG Bone Density     Crohn's disease of colon without complication (HCC)       Relevant Orders   CBC with Differential     Primary hypertension       Relevant Medications   aspirin 81 MG chewable tablet   atorvastatin (LIPITOR) 80 MG tablet   metoprolol succinate (TOPROL-XL) 50 MG 24 hr tablet   amLODipine (NORVASC) 10 MG tablet   Other Relevant Orders   TSH + free T4       Return in about 3 months (around 11/16/2023).    Alease Medina, MD

## 2023-08-19 NOTE — Assessment & Plan Note (Signed)
 Take cyclobenzaprine 5 mg twice daily.

## 2023-08-19 NOTE — Assessment & Plan Note (Signed)
 Will check his bone mineral density as this is a secondary cause of osteoporosis.

## 2023-08-19 NOTE — Telephone Encounter (Signed)
 error

## 2023-08-20 ENCOUNTER — Other Ambulatory Visit: Payer: Self-pay

## 2023-08-20 LAB — CBC WITH DIFFERENTIAL/PLATELET
Basophils Absolute: 0.1 10*3/uL (ref 0.0–0.2)
Basos: 2 %
EOS (ABSOLUTE): 0.5 10*3/uL — ABNORMAL HIGH (ref 0.0–0.4)
Eos: 5 %
Hematocrit: 46.8 % (ref 37.5–51.0)
Hemoglobin: 14.8 g/dL (ref 13.0–17.7)
Immature Grans (Abs): 0 10*3/uL (ref 0.0–0.1)
Immature Granulocytes: 0 %
Lymphocytes Absolute: 1.8 10*3/uL (ref 0.7–3.1)
Lymphs: 19 %
MCH: 30.6 pg (ref 26.6–33.0)
MCHC: 31.6 g/dL (ref 31.5–35.7)
MCV: 97 fL (ref 79–97)
Monocytes Absolute: 0.7 10*3/uL (ref 0.1–0.9)
Monocytes: 7 %
Neutrophils Absolute: 6.4 10*3/uL (ref 1.4–7.0)
Neutrophils: 67 %
Platelets: 245 10*3/uL (ref 150–450)
RBC: 4.83 x10E6/uL (ref 4.14–5.80)
RDW: 12.1 % (ref 11.6–15.4)
WBC: 9.5 10*3/uL (ref 3.4–10.8)

## 2023-08-20 LAB — CMP14+EGFR
ALT: 31 IU/L (ref 0–44)
AST: 22 IU/L (ref 0–40)
Albumin: 4.2 g/dL (ref 3.9–4.9)
Alkaline Phosphatase: 113 IU/L (ref 44–121)
BUN/Creatinine Ratio: 22 (ref 10–24)
BUN: 16 mg/dL (ref 8–27)
Bilirubin Total: 0.4 mg/dL (ref 0.0–1.2)
CO2: 27 mmol/L (ref 20–29)
Calcium: 9.5 mg/dL (ref 8.6–10.2)
Chloride: 97 mmol/L (ref 96–106)
Creatinine, Ser: 0.73 mg/dL — ABNORMAL LOW (ref 0.76–1.27)
Globulin, Total: 2.7 g/dL (ref 1.5–4.5)
Glucose: 290 mg/dL — ABNORMAL HIGH (ref 70–99)
Potassium: 4.3 mmol/L (ref 3.5–5.2)
Sodium: 136 mmol/L (ref 134–144)
Total Protein: 6.9 g/dL (ref 6.0–8.5)
eGFR: 104 mL/min/{1.73_m2} (ref >59)

## 2023-08-20 LAB — LIPID PANEL
Chol/HDL Ratio: 3.6 ratio (ref 0.0–5.0)
Cholesterol, Total: 118 mg/dL (ref 100–199)
HDL: 33 mg/dL — ABNORMAL LOW (ref >39)
LDL Chol Calc (NIH): 59 mg/dL (ref 0–99)
Triglycerides: 147 mg/dL (ref 0–149)
VLDL Cholesterol Cal: 26 mg/dL (ref 5–40)

## 2023-08-20 LAB — TSH+FREE T4
Free T4: 1.23 ng/dL (ref 0.82–1.77)
TSH: 1.18 u[IU]/mL (ref 0.450–4.500)

## 2023-08-20 LAB — VITAMIN D 25 HYDROXY (VIT D DEFICIENCY, FRACTURES): Vit D, 25-Hydroxy: 52.5 ng/mL (ref 30.0–100.0)

## 2023-08-20 MED ORDER — METOPROLOL SUCCINATE ER 50 MG PO TB24: 50.0000 mg | ORAL_TABLET | Freq: Every day | ORAL | 3 refills | Status: AC

## 2023-08-20 MED ORDER — METOPROLOL SUCCINATE ER 50 MG PO TB24: 50.0000 mg | ORAL_TABLET | Freq: Every day | ORAL | 1 refills | Status: DC

## 2023-08-20 NOTE — Telephone Encounter (Signed)
 Copied from CRM [id number]. Topic: Clinical - Prescription Issue >> Aug 20, 2023  1:48 PM Antony Haste wrote: Reason for CRM: PT is requesting refill of Metoprolol Succinate 50 MG. PT has not been able to pick up Farxiga 10 MG or Glipizide 5 MG, his pharmacy states they haven't received the refill approval from his PCP. Advised both were signed off to the [facility] yesterday after his office visit. The patient is requesting for someone to contact his pharmacy for this prescription issue.

## 2023-08-20 NOTE — Telephone Encounter (Signed)
 Pharmacy contacted and clarified/Patient notified/Chart updated.

## 2023-09-13 ENCOUNTER — Other Ambulatory Visit: Payer: Self-pay | Admitting: Family Medicine

## 2023-09-13 MED ORDER — ATORVASTATIN CALCIUM 80 MG PO TABS: 80.0000 mg | ORAL_TABLET | Freq: Every day | ORAL | 0 refills | Status: DC

## 2023-09-13 NOTE — Telephone Encounter (Signed)
 Pharmacy sent this request 09/11/2023 at 11:55 am. Pt is completely out [employer] from [facility])   Copied from [employer] [id number]. Topic: Clinical - Medication Refill >> Sep 13, 2023  9:03 AM Franchot Heidelberg wrote: Most Recent Primary Care Visit:  Provider: Alease Medina  Department: [facility]  Visit Type: NEW PT - OFFICE VISIT  Date: 08/19/2023  Medication: atorvastatin (LIPITOR) 80 MG tablet  Has the patient contacted their pharmacy? Yes (Agent: If no, request that the patient contact the pharmacy for the refill. If patient does not wish to contact the pharmacy document the reason why and proceed with request.) (Agent: If yes, when and what did the pharmacy advise?)  Is this the correct pharmacy for this prescription? Yes If no, delete pharmacy and type the correct one.  This is the patient's preferred pharmacy:  [facility] - [place], [place] - [address] [place] [address] Phone: [phone] Fax: [phone]   Has the prescription been filled recently? Yes  Is the patient out of the medication? Yes  Has the patient been seen for an appointment in the last year OR does the patient have an upcoming appointment? Yes  Can we respond through MyChart? Yes  Agent: Please be advised that Rx refills may take up to 3 business days. We ask that you follow-up with your pharmacy.

## 2023-09-16 DIAGNOSIS — Z85850 Personal history of malignant neoplasm of thyroid: Secondary | ICD-10-CM | POA: Insufficient documentation

## 2023-09-16 DIAGNOSIS — R0902 Hypoxemia: Secondary | ICD-10-CM | POA: Insufficient documentation

## 2023-09-16 DIAGNOSIS — Z8673 Personal history of transient ischemic attack (TIA), and cerebral infarction without residual deficits: Secondary | ICD-10-CM | POA: Insufficient documentation

## 2023-09-18 DIAGNOSIS — J9621 Acute and chronic respiratory failure with hypoxia: Secondary | ICD-10-CM | POA: Insufficient documentation

## 2023-09-18 DIAGNOSIS — J9622 Acute and chronic respiratory failure with hypercapnia: Secondary | ICD-10-CM | POA: Insufficient documentation

## 2023-09-23 ENCOUNTER — Ambulatory Visit (INDEPENDENT_AMBULATORY_CARE_PROVIDER_SITE_OTHER): Admitting: Family Medicine

## 2023-09-23 ENCOUNTER — Encounter: Payer: Self-pay | Admitting: Family Medicine

## 2023-09-23 VITALS — BP 118/84 | HR 59 | Temp 97.4°F | Resp 18 | Ht 70.5 in | Wt 246.0 lb

## 2023-09-23 DIAGNOSIS — T7840XS Allergy, unspecified, sequela: Secondary | ICD-10-CM

## 2023-09-23 DIAGNOSIS — I5032 Chronic diastolic (congestive) heart failure: Secondary | ICD-10-CM | POA: Insufficient documentation

## 2023-09-23 DIAGNOSIS — G4734 Idiopathic sleep related nonobstructive alveolar hypoventilation: Secondary | ICD-10-CM | POA: Diagnosis not present

## 2023-09-23 DIAGNOSIS — E66811 Obesity, class 1: Secondary | ICD-10-CM | POA: Diagnosis not present

## 2023-09-23 DIAGNOSIS — Z6834 Body mass index (BMI) 34.0-34.9, adult: Secondary | ICD-10-CM

## 2023-09-23 DIAGNOSIS — G4733 Obstructive sleep apnea (adult) (pediatric): Secondary | ICD-10-CM

## 2023-09-23 DIAGNOSIS — E6609 Other obesity due to excess calories: Secondary | ICD-10-CM

## 2023-09-23 MED ORDER — FUROSEMIDE 40 MG PO TABS: 40.0000 mg | ORAL_TABLET | Freq: Every day | ORAL | 1 refills | Status: DC

## 2023-09-23 MED ORDER — WEGOVY 0.25 MG/0.5ML ~~LOC~~ SOAJ: 0.2500 mg | SUBCUTANEOUS | 0 refills | Status: DC

## 2023-09-23 MED ORDER — FLUTICASONE PROPIONATE 50 MCG/ACT NA SUSP: 2.0000 | Freq: Every day | NASAL | 3 refills | Status: DC

## 2023-09-23 NOTE — Progress Notes (Signed)
 New Patient Office Visit  Subjective    Patient ID: Jeffrey Solomon, male    DOB: [birth year]  Age: 63 y.o. MRN: [id number]  CC:  Chief Complaint  Patient presents with   Medical Management of Chronic Issues   Apnea    HPI Jeffrey Solomon [facility] presents to establish care Delightful 63 year old with HTN, untreated OSA, ischemic CVA, DMT2, UC, thyroid cancer s/p thyroid lobectomy, Hx EtOH misuse in remission, s/p UUU plasty.  Here for a hospital follow-up, admitted 09/15/2023 and discharged 09/18/2023.  Presented to the ED with a swollen right leg and hypoxia/hypercapnia, appears multifactorial related to untreated OSA left hemidiaphragm elevation and volume overload, obesity hypoventilation.  Lower extremity Dopplers were negative..  Chest x-ray showed hemidiaphragm paralysis.  Echo showed HFpEF with EF>55 and LV wall thickness undetermined, diastolic function indeterminate.  He was started on diuresis trial with resolution of the lower extremity edema and ambulatory hypoxia.  He was given 7 days of Lasix 40 mg which he has completed.  Has no swelling in either leg now and is breathing fine.  In the hospital he was offered him NIPPF but he declined.   Sleeps sitting up and has OSA for years and was initially treated with UUUplasty.  He is morbidly obese with BMI of 34+.  Outpatient Encounter Medications as of 09/23/2023  Medication Sig   aspirin 81 MG chewable tablet Chew by mouth.   atorvastatin (LIPITOR) 80 MG tablet Take 1 tablet (80 mg total) by mouth daily.   cyclobenzaprine (FLEXERIL) 5 MG tablet TAKE ONE TO TWO TABLETS BY MOUTH TWICE DAILY AS NEEDED FOR MUSCLE SPASMS   dapagliflozin propanediol (FARXIGA) 10 MG TABS tablet Take 1 tablet (10 mg total) by mouth daily before breakfast.   furosemide (LASIX) 40 MG tablet Take 1 tablet (40 mg total) by mouth daily.   glipiZIDE (GLUCOTROL) 5 MG tablet Take 1 tablet (5 mg total) by mouth 2 (two) times daily before a meal.   metoprolol succinate  (TOPROL-XL) 50 MG 24 hr tablet Take 1 tablet (50 mg total) by mouth daily. Take with or immediately following a meal.   WEGOVY 0.25 MG/0.5ML SOAJ Inject 0.25 mg into the skin once a week. Use this dose for 1 month (4 shots) and then increase to next higher dose.   [DISCONTINUED] fluticasone (FLONASE) 50 MCG/ACT nasal spray    famotidine (PEPCID) 20 MG tablet Take 1 tablet (20 mg total) by mouth 2 (two) times daily. (Patient not taking: Reported on 09/23/2023)   fluticasone (FLONASE) 50 MCG/ACT nasal spray Place 2 sprays into both nostrils daily.   hydrochlorothiazide (HYDRODIURIL) 25 MG tablet Take 1 tablet (25 mg total) by mouth daily.   levothyroxine (SYNTHROID) 25 MCG tablet Take by mouth. (Patient not taking: Reported on 08/19/2023)   pregabalin (LYRICA) 150 MG capsule Take 150 mg by mouth 3 (three) times daily.   simethicone (MYLICON) 125 MG chewable tablet Chew by mouth. (Patient not taking: Reported on 09/23/2023)   [DISCONTINUED] amLODipine (NORVASC) 10 MG tablet Take 1 tablet (10 mg total) by mouth daily.   [DISCONTINUED] amLODipine (NORVASC) 10 MG tablet Take 1 tablet by mouth daily. (Patient not taking: Reported on 09/23/2023)   [DISCONTINUED] chlorthalidone (HYGROTON) 25 MG tablet Take 1 tablet (25 mg total) by mouth daily.   [DISCONTINUED] metoprolol succinate (TOPROL-XL) 50 MG 24 hr tablet Take 1 tablet (50 mg total) by mouth daily.   No facility-administered encounter medications on file as of 09/23/2023.    Past Medical  History:  Diagnosis Date   Diabetes mellitus without complication (HCC)    Hypertension     History reviewed. No pertinent surgical history.  History reviewed. No pertinent family history.  Social History   Socioeconomic History   Marital status: Divorced    Spouse name: Not on file   Number of children: Not on file   Years of education: Not on file   Highest education level: Not on file  Occupational History   Not on file  Tobacco Use   Smoking  status: Never    Passive exposure: Never   Smokeless tobacco: Never  Substance and Sexual Activity   Alcohol use: Not Currently   Drug use: Not on file   Sexual activity: Not on file  Other Topics Concern   Not on file  Social History Narrative   Not on file   Social Drivers of Health   Financial Resource Strain: Low Risk  (09/17/2023)   Received from [facility]   Overall Financial Resource Strain (CARDIA)    Difficulty of Paying Living Expenses: Not very hard  Food Insecurity: No Food Insecurity (09/17/2023)   Received from [facility]   Hunger Vital Sign    Worried About Running Out of Food in the Last Year: Never true    Ran Out of Food in the Last Year: Never true  Transportation Needs: No Transportation Needs (09/17/2023)   Received from [facility]   PRAPARE - Transportation    Lack of Transportation (Medical): No    Lack of Transportation (Non-Medical): No  Physical Activity: Not on file  Stress: Not on file  Social Connections: Not on file  Intimate Partner Violence: Not on file    ROS      Objective   BP 118/84 (BP Location: Left Arm, Patient Position: Sitting, Cuff Size: Normal)   Pulse (!) 59   Temp (!) 97.4 F (36.3 C) (Oral)   Resp 18   Ht 5' 10.5" (1.791 m)   Wt 246 lb (111.6 kg)   SpO2 94%   BMI 34.80 kg/m    Physical Exam Vitals and nursing note reviewed.  Constitutional:      Appearance: Normal appearance.  HENT:     Head: Normocephalic and atraumatic.  Eyes:     Conjunctiva/sclera: Conjunctivae normal.  Cardiovascular:     Rate and Rhythm: Normal rate and regular rhythm.  Pulmonary:     Effort: Pulmonary effort is normal.     Breath sounds: Normal breath sounds.  Musculoskeletal:     Right lower leg: No edema.     Left lower leg: No edema.  Skin:    General: Skin is warm and dry.  Neurological:     Mental Status: He is alert and oriented to person, place, and time.  Psychiatric:        Mood and Affect: Mood normal.         Behavior: Behavior normal.        Thought Content: Thought content normal.        Judgment: Judgment normal.            The ASCVD Risk score (Arnett DK, et al., 2019) failed to calculate for the following reasons:   Risk score cannot be calculated because patient has a medical history suggesting prior/existing ASCVD     Assessment & Plan:  Allergy, sequela -     Fluticasone Propionate; Place 2 sprays into both nostrils daily.  Dispense: 18.2 mL; Refill: 3  Chronic heart failure with preserved ejection fraction (HCC) -     Furosemide; Take 1 tablet (40 mg total) by mouth daily.  Dispense: 30 tablet; Refill: 1 -     Pulmonary Visit  Morbid obesity (HCC) -     RUEAVW; Inject 0.25 mg into the skin once a week. Use this dose for 1 month (4 shots) and then increase to next higher dose.  Dispense: 2 mL; Refill: 0  Idiopathic sleep related nonobstructive alveolar hypoventilation -     Pulmonary Visit -     Ambulatory referral to Sleep Studies  Chronic heart failure with preserved ejection fraction (HFpEF) (HCC) Assessment & Plan: Is on Norvasc and this may be contributing to his fluid overload.  Consideration of stopping Norvasc and replacing with ACEI.Marland Kitchen  Already taking [place].    Gave him a prescription for Lasix 40 mg.  If you develop swelling in your legs then restart the Lasix at 40 mg daily please.  Follow-up in the office in a month   Class 1 obesity due to excess calories with serious comorbidity and body mass index (BMI) of 34.0 to 34.9 in adult Assessment & Plan: Starting [facility] deceiving get him to lose weight.  Discussed the side effect profile of pancreatitis, cholecystitis and cholecystectomy, constipation and nausea.   OSA (obstructive sleep apnea) Assessment & Plan: Sending for sleep study.  Currently sleeping sitting up.  Had diagnosed sleep apnea and had a UUUplasty at age 56     Return in about 4 weeks (around 10/21/2023), or Talk about GLP-1.    Alease Medina, MD

## 2023-09-23 NOTE — Assessment & Plan Note (Signed)
 Sending for sleep study.  Currently sleeping sitting up.  Had diagnosed sleep apnea and had a UUUplasty at age 63

## 2023-09-23 NOTE — Assessment & Plan Note (Signed)
 Starting [facility] deceiving get him to lose weight.  Discussed the side effect profile of pancreatitis, cholecystitis and cholecystectomy, constipation and nausea.

## 2023-09-23 NOTE — Assessment & Plan Note (Addendum)
 Is on Norvasc and this may be contributing to his fluid overload.  Consideration of stopping Norvasc and replacing with ACEI.Marland Kitchen  Already taking [place].    Gave him a prescription for Lasix 40 mg.  If you develop swelling in your legs then restart the Lasix at 40 mg daily please.  Follow-up in the office in a month

## 2023-09-25 ENCOUNTER — Encounter: Payer: Self-pay | Admitting: Sleep Medicine

## 2023-09-25 ENCOUNTER — Ambulatory Visit: Admitting: Sleep Medicine

## 2023-09-25 VITALS — BP 110/72 | HR 70 | Temp 97.1°F | Ht 70.5 in | Wt 248.6 lb

## 2023-09-25 DIAGNOSIS — G4733 Obstructive sleep apnea (adult) (pediatric): Secondary | ICD-10-CM

## 2023-09-25 DIAGNOSIS — E119 Type 2 diabetes mellitus without complications: Secondary | ICD-10-CM

## 2023-09-25 DIAGNOSIS — Z6835 Body mass index (BMI) 35.0-35.9, adult: Secondary | ICD-10-CM

## 2023-09-25 DIAGNOSIS — I10 Essential (primary) hypertension: Secondary | ICD-10-CM

## 2023-09-25 DIAGNOSIS — E669 Obesity, unspecified: Secondary | ICD-10-CM

## 2023-09-25 NOTE — Patient Instructions (Signed)
 Jeffrey Solomon

## 2023-09-25 NOTE — Progress Notes (Signed)
 Jeffrey Solomon MRN: [id number] DOB: Jun 14, 1961   CHIEF COMPLAINT:  EXCESSIVE DAYTIME SLEEPINESS   HISTORY OF PRESENT ILLNESS:  Jeffrey Solomon is a 63 y.o. w/ a h/o OSA s/p UP3, CVA, HTN, DMII, hyperlipiemia, obesity and hypothyroidism who present for reassessment of OSA. Reports that he underwent uvulopalatopharyngoplasty around 25 years ago. States that he underwent reassessment of OSA after surgery which revealed mild to moderate residual sleep apnea. Reports a 30-40 lbs weight gain over the last few months.   Reports c/o loud snoring, witnessed apnea and excessive daytime sleepiness which has been present for several years. Reports nocturnal awakenings due to nocturia, however does not have difficulty falling back to sleep. Admits to dry mouth. Denies morning headaches, RLS symptoms, dream enactment, cataplexy, hypnagogic or hypnapompic hallucinations. Reports a family history of sleep apnea. Denies drowsy driving. Drinks 2-3 sodas daily, denies alcohol, tobacco or illicit drug use.   Bedtime 11:30 pm-12 am Sleep onset 20 mins Rise time 7 am   EPWORTH SLEEP SCORE 9    09/25/2023    2:00 PM  Results of the Epworth flowsheet  Sitting and reading 0  Watching TV 3  Sitting, inactive in a public place (e.g. a theatre or a meeting) 0  As a passenger in a car for an hour without a break 0  Lying down to rest in the afternoon when circumstances permit 3  Sitting and talking to someone 0  Sitting quietly after a lunch without alcohol 3  In a car, while stopped for a few minutes in traffic 0  Total score 9    PAST MEDICAL HISTORY :   has a past medical history of Diabetes mellitus without complication (HCC) and Hypertension.  has no past surgical history on file. Prior to Admission medications   Medication Sig Start Date End Date Taking? Authorizing Provider  aspirin 81 MG chewable tablet Chew by mouth. 12/17/22 12/17/23 Yes [provider]  atorvastatin  (LIPITOR) 80 MG tablet Take 1 tablet (80 mg total) by mouth daily. 09/13/23  Yes Ziglar, Eli Phillips, MD  cyclobenzaprine (FLEXERIL) 5 MG tablet TAKE ONE TO TWO TABLETS BY MOUTH TWICE DAILY AS NEEDED FOR MUSCLE SPASMS 07/12/23  Yes [provider]  dapagliflozin propanediol (FARXIGA) 10 MG TABS tablet Take 1 tablet (10 mg total) by mouth daily before breakfast. 08/19/23  Yes Ziglar, Eli Phillips, MD  fluticasone (FLONASE) 50 MCG/ACT nasal spray Place 2 sprays into both nostrils daily. 09/23/23  Yes Ziglar, Eli Phillips, MD  furosemide (LASIX) 40 MG tablet Take 1 tablet (40 mg total) by mouth daily. 09/23/23  Yes Ziglar, Eli Phillips, MD  glipiZIDE (GLUCOTROL) 5 MG tablet Take 1 tablet (5 mg total) by mouth 2 (two) times daily before a meal. 08/19/23  Yes Ziglar, Eli Phillips, MD  metoprolol succinate (TOPROL-XL) 50 MG 24 hr tablet Take 1 tablet (50 mg total) by mouth daily. Take with or immediately following a meal. 08/20/23  Yes Ziglar, Eli Phillips, MD  pregabalin (LYRICA) 150 MG capsule Take 150 mg by mouth 3 (three) times daily.   Yes [provider]  WEGOVY 0.25 MG/0.5ML SOAJ Inject 0.25 mg into the skin once a week. Use this dose for 1 month (4 shots) and then increase to next higher dose. 09/23/23  Yes Ziglar, Eli Phillips, MD  famotidine (PEPCID) 20 MG tablet Take 1 tablet (20 mg total) by mouth 2 (two) times daily. Patient not taking: Reported on 08/19/2023 09/15/19   Scotty Court,  Aneta Mins, MD  hydrochlorothiazide (HYDRODIURIL) 25 MG tablet Take 1 tablet (25 mg total) by mouth daily. 09/15/19 10/15/19  Sharman Cheek, MD  levothyroxine (SYNTHROID) 25 MCG tablet Take by mouth. Patient not taking: Reported on 09/25/2023 01/01/22   [provider]  simethicone (MYLICON) 125 MG chewable tablet Chew by mouth. Patient not taking: Reported on 08/19/2023 06/29/21   [provider]  chlorthalidone (HYGROTON) 25 MG tablet Take 1 tablet (25 mg total) by mouth daily. 12/31/15 09/15/19  Ovidio Kin, MD   Allergies   Allergen Reactions   Gramineae Pollens     Allergic to everything outside, per pt;  Sneeze, runny nose    FAMILY HISTORY:  family history is not on file. SOCIAL HISTORY:  reports that he has never smoked. He has never been exposed to tobacco smoke. He has never used smokeless tobacco. He reports that he does not currently use alcohol.   Review of Systems:  Gen:  Denies  fever, sweats, chills weight loss  HEENT: Denies blurred vision, double vision, ear pain, eye pain, hearing loss, nose bleeds, sore throat Cardiac:  No dizziness, chest pain or heaviness, chest tightness,edema, No JVD Resp:   No cough, -sputum production, -shortness of breath,-wheezing, -hemoptysis,  Gi: Denies swallowing difficulty, stomach pain, nausea or vomiting, diarrhea, constipation, bowel incontinence Gu:  Denies bladder incontinence, burning urine Ext:   Denies Joint pain, stiffness or swelling Skin: Denies  skin rash, easy bruising or bleeding or hives Endoc:  Denies polyuria, polydipsia , polyphagia or weight change Psych:   Denies depression, insomnia or hallucinations  Other:  All other systems negative  VITAL SIGNS: BP 110/72 (BP Location: Right Arm, Cuff Size: Large)   Pulse 70   Temp (!) 97.1 F (36.2 C)   Ht 5' 10.5" (1.791 m)   Wt 248 lb 9.6 oz (112.8 kg)   SpO2 100%   BMI 35.17 kg/m    Physical Examination:   General Appearance: No distress  EYES PERRLA, EOM intact.   NECK Supple, No JVD Pulmonary: normal breath sounds, No wheezing.  CardiovascularNormal S1,S2.  No m/r/g.   Abdomen: Benign, Soft, non-tender. Skin:   warm, no rashes, no ecchymosis  Extremities: normal, no cyanosis, clubbing. Neuro:without focal findings,  speech normal  PSYCHIATRIC: Mood, affect within normal limits.   ASSESSMENT AND PLAN  OSA I suspect that OSA is likely present due to clinical presentation. Discussed the consequences of untreated sleep apnea. Advised not to drive drowsy for safety of patient  and others. Will complete further evaluation with a home sleep study and follow up to review results.    HTN Stable, on current management. Following with PCP.   DMII Stable, on current management.   Obesity  Counseled patient on diet and lifestyle modification.    MEDICATION ADJUSTMENTS/LABS AND TESTS ORDERED: Recommend Sleep Study   Patient  satisfied with Plan of action and management. All questions answered  Follow up to review HST results and treatment plan.   I spent a total of 45 minutes reviewing chart data, face-to-face evaluation with the patient, counseling and coordination of care as detailed above.    Tempie Hoist, M.D.  Sleep Medicine [facility]

## 2023-10-14 ENCOUNTER — Telehealth: Payer: Self-pay

## 2023-10-14 NOTE — Telephone Encounter (Signed)
 Sleep study referral has been faxed to [facility] at [phone].

## 2023-10-14 NOTE — Telephone Encounter (Signed)
 Copied from CRM [id number]. Topic: Clinical - Request for Lab/Test Order >> Oct 14, 2023  2:44 PM Danae Duncans wrote: Reason for CRM: Pt register for home sleep test at [facility] [phone]  [phone] , facility is needing the order

## 2023-10-15 LAB — HM DIABETES EYE EXAM

## 2023-11-06 ENCOUNTER — Ambulatory Visit

## 2023-11-06 DIAGNOSIS — G4733 Obstructive sleep apnea (adult) (pediatric): Secondary | ICD-10-CM

## 2023-11-19 ENCOUNTER — Ambulatory Visit (INDEPENDENT_AMBULATORY_CARE_PROVIDER_SITE_OTHER): Payer: Medicaid Other | Admitting: Family Medicine

## 2023-11-19 ENCOUNTER — Encounter: Payer: Self-pay | Admitting: Family Medicine

## 2023-11-19 ENCOUNTER — Telehealth: Payer: Self-pay

## 2023-11-19 VITALS — BP 126/79 | HR 71 | Temp 97.5°F | Resp 18 | Ht 70.5 in | Wt 257.0 lb

## 2023-11-19 DIAGNOSIS — L84 Corns and callosities: Secondary | ICD-10-CM | POA: Diagnosis not present

## 2023-11-19 DIAGNOSIS — E039 Hypothyroidism, unspecified: Secondary | ICD-10-CM

## 2023-11-19 DIAGNOSIS — E118 Type 2 diabetes mellitus with unspecified complications: Secondary | ICD-10-CM

## 2023-11-19 DIAGNOSIS — G4733 Obstructive sleep apnea (adult) (pediatric): Secondary | ICD-10-CM

## 2023-11-19 DIAGNOSIS — I5032 Chronic diastolic (congestive) heart failure: Secondary | ICD-10-CM

## 2023-11-19 LAB — POCT GLYCOSYLATED HEMOGLOBIN (HGB A1C): Hemoglobin A1C: 9.3 % — AB (ref 4.0–5.6)

## 2023-11-19 MED ORDER — OZEMPIC (0.25 OR 0.5 MG/DOSE) 2 MG/1.5ML ~~LOC~~ SOPN
PEN_INJECTOR | SUBCUTANEOUS | 1 refills | Status: DC
Start: 2023-11-19 — End: 2024-01-06

## 2023-11-19 MED ORDER — FUROSEMIDE 40 MG PO TABS: 40.0000 mg | ORAL_TABLET | Freq: Every day | ORAL | 1 refills | Status: DC

## 2023-11-19 NOTE — Telephone Encounter (Signed)
 Message from Plan Approved. This drug has been approved.   Approved quantity: 3 units per 56 day(s). The drug has been approved from 11/05/2023 to 11/18/2024.   Generic or biosimilar substitution may be required when available and preferred on the formulary.   Authorization Expiration Date: Nov 18, 2024.

## 2023-11-19 NOTE — Assessment & Plan Note (Signed)
 Multifactorial including obesity, hypoventilation and untreated OSA.  Denies orthopnea PND and peripheral edema.  Currently takes Lasix  40 mg daily.  Checking his basic for potassium

## 2023-11-19 NOTE — Telephone Encounter (Signed)
(  Key: [id number])  Rx #: [id number]  Ozempic (0.25 or 0.5 MG/DOSE) 2MG /3ML pen-injectors Form  [employer] [place]  Electronic Prior Authorization Request Form [phone] NCPDP)

## 2023-11-19 NOTE — Assessment & Plan Note (Signed)
 Trial Ozempic 0.25 mg weekly for 4 weeks.  Continue Farxiga  and glipizide  5 mg twice daily.  Follow-up in the office in 4 weeks.  Please check your feet daily.  Referral back to podiatry for new insoles.  Has calluses on both feet.

## 2023-11-19 NOTE — Assessment & Plan Note (Signed)
 Referral to podiatry for insoles.  Has hammertoes and calluses on both feet.

## 2023-11-19 NOTE — Progress Notes (Signed)
 Established Patient Office Visit  Subjective   Patient ID: Jeffrey Solomon, male    DOB: 1961-02-03  Age: 63 y.o. MRN: [id number]  Chief Complaint  Patient presents with   Hypertension   Tinnitus    17 yrs   Diabetes    HPI Delightful 63 year old with HTN, untreated OSA, ischemic CVA, DMT2, obesity, UC, thyroid cancer s/p thyroid lobectomy, left hemidiaphragm elevation Hx EtOH misuse in remission, s/p UU plasty, hypoxia/hypercapnia (09/15/2023 hospitalization for volume overload and hypoventilation.  09/16/2023 echo HFpEF with EF greater than 55, LV wall thickness undetermined, diastolic function undetermined..  Was offered NIPPF at discharge but declined) Denies any edema in his legs, PND or orthopnea. 10/15/2023 diabetic eye exam and no diabetic retinopathy. His insurance would not cover Wegovy .  Currently diabetes treated with Farxiga  and glipizide  5 mg twice daily.  Reports no episodes of low blood sugar or shakiness, confusion, dizziness, diaphoresis.  A1c today 9.3% down from 10.8% 3 months ago.  Does not take metformin secondary to Crohn's disease. Checks his feet routinely.  Has insoles for his shoes.  Would like to go back to podiatry to get new insoles. Does take an 81 mg aspirin daily     ROS    Objective:      BP 126/79 (BP Location: Left Arm, Patient Position: Sitting, Cuff Size: Normal)   Pulse 71   Temp (!) 97.5 F (36.4 C) (Oral)   Resp 18   Ht 5' 10.5" (1.791 m)   Wt 257 lb (116.6 kg)   SpO2 92%   BMI 36.35 kg/m    Physical Exam Vitals and nursing note reviewed.  Constitutional:      Appearance: Normal appearance.  HENT:     Head: Normocephalic and atraumatic.  Eyes:     Conjunctiva/sclera: Conjunctivae normal.  Cardiovascular:     Rate and Rhythm: Normal rate and regular rhythm.     Pulses:          Dorsalis pedis pulses are 2+ on the right side and 2+ on the left side.       Posterior tibial pulses are 2+ on the right side and 2+ on the left  side.  Pulmonary:     Effort: Pulmonary effort is normal.     Breath sounds: Normal breath sounds.  Musculoskeletal:     Right lower leg: No edema.     Left lower leg: No edema.  Feet:     Right foot:     Protective Sensation: 5 sites tested.  4 sites sensed.     Skin integrity: Callus present.     Left foot:     Protective Sensation: 5 sites tested.  3 sites sensed.     Comments: Has calluses on his feet and toes.  Has hammertoes. Skin:    General: Skin is warm and dry.  Neurological:     Mental Status: He is alert and oriented to person, place, and time.  Psychiatric:        Mood and Affect: Mood normal.        Behavior: Behavior normal.        Thought Content: Thought content normal.        Judgment: Judgment normal.          Results for orders placed or performed in visit on 11/19/23  POCT glycosylated hemoglobin (Hb A1C)  Result Value Ref Range   Hemoglobin A1C 9.3 (A) 4.0 - 5.6 %   HbA1c POC (<> result, manual  entry)     HbA1c, POC (prediabetic range)     HbA1c, POC (controlled diabetic range)        The ASCVD Risk score (Arnett DK, et al., 2019) failed to calculate for the following reasons:   Risk score cannot be calculated because patient has a medical history suggesting prior/existing ASCVD    Assessment & Plan:  Type 2 diabetes with complication (HCC) Assessment & Plan: Trial Ozempic 0.25 mg weekly for 4 weeks.  Continue Farxiga  and glipizide  5 mg twice daily.  Follow-up in the office in 4 weeks.  Please check your feet daily.  Referral back to podiatry for new insoles.  Has calluses on both feet.  Orders: -     POCT glycosylated hemoglobin (Hb A1C) -     Ozempic (0.25 or 0.5 MG/DOSE); Inject 0.25 mg subq qwk for 4 wk then inject 0.5 mg subq qwk x4 wk, then go to full dose pen qwk  Dispense: 3 mL; Refill: 1 -     Lipid panel -     CBC with Differential/Platelet -     Comprehensive metabolic panel with GFR -     Hemoglobin A1c -     Microalbumin /  creatinine urine ratio -     Ambulatory referral to Podiatry  Chronic heart failure with preserved ejection fraction (HCC) -     Furosemide ; Take 1 tablet (40 mg total) by mouth daily.  Dispense: 30 tablet; Refill: 1  Foot callus Assessment & Plan: Referral to podiatry for insoles.  Has hammertoes and calluses on both feet.  Orders: -     Ambulatory referral to Podiatry -     Ambulatory referral to Podiatry  Acquired hypothyroidism -     TSH + free T4  OSA (obstructive sleep apnea) Assessment & Plan: Maybe if he loses some weight with Ozempic his obstructive sleep apnea will improve.  Discussed NIPPF.     Chronic heart failure with preserved ejection fraction (HFpEF) (HCC) Assessment & Plan: Multifactorial including obesity, hypoventilation and untreated OSA.  Denies orthopnea PND and peripheral edema.  Currently takes Lasix  40 mg daily.  Checking his basic for potassium      Return in about 4 weeks (around 12/17/2023).    Keyleigh Manninen K Annitta Fifield, MD

## 2023-11-19 NOTE — Assessment & Plan Note (Signed)
 Maybe if he loses some weight with Ozempic  his obstructive sleep apnea will improve.  Discussed NIPPF.

## 2023-11-20 LAB — COMPREHENSIVE METABOLIC PANEL WITH GFR
ALT: 29 IU/L (ref 0–44)
AST: 21 IU/L (ref 0–40)
Albumin: 4.4 g/dL (ref 3.9–4.9)
Alkaline Phosphatase: 90 IU/L (ref 44–121)
BUN/Creatinine Ratio: 22 (ref 10–24)
BUN: 16 mg/dL (ref 8–27)
Bilirubin Total: 0.6 mg/dL (ref 0.0–1.2)
CO2: 25 mmol/L (ref 20–29)
Calcium: 9.3 mg/dL (ref 8.6–10.2)
Chloride: 100 mmol/L (ref 96–106)
Creatinine, Ser: 0.73 mg/dL — ABNORMAL LOW (ref 0.76–1.27)
Globulin, Total: 2.8 g/dL (ref 1.5–4.5)
Glucose: 122 mg/dL — ABNORMAL HIGH (ref 70–99)
Potassium: 5.5 mmol/L — ABNORMAL HIGH (ref 3.5–5.2)
Sodium: 142 mmol/L (ref 134–144)
Total Protein: 7.2 g/dL (ref 6.0–8.5)
eGFR: 104 mL/min/{1.73_m2} (ref >59)

## 2023-11-20 LAB — CBC WITH DIFFERENTIAL/PLATELET
Basophils Absolute: 0.2 10*3/uL (ref 0.0–0.2)
Basos: 2 %
EOS (ABSOLUTE): 0.5 10*3/uL — ABNORMAL HIGH (ref 0.0–0.4)
Eos: 6 %
Hematocrit: 53 % — ABNORMAL HIGH (ref 37.5–51.0)
Hemoglobin: 16.8 g/dL (ref 13.0–17.7)
Immature Grans (Abs): 0 10*3/uL (ref 0.0–0.1)
Immature Granulocytes: 0 %
Lymphocytes Absolute: 1.9 10*3/uL (ref 0.7–3.1)
Lymphs: 22 %
MCH: 29.5 pg (ref 26.6–33.0)
MCHC: 31.7 g/dL (ref 31.5–35.7)
MCV: 93 fL (ref 79–97)
Monocytes Absolute: 0.7 10*3/uL (ref 0.1–0.9)
Monocytes: 8 %
Neutrophils Absolute: 5.5 10*3/uL (ref 1.4–7.0)
Neutrophils: 62 %
Platelets: 263 10*3/uL (ref 150–450)
RBC: 5.69 x10E6/uL (ref 4.14–5.80)
RDW: 12.5 % (ref 11.6–15.4)
WBC: 8.7 10*3/uL (ref 3.4–10.8)

## 2023-11-20 LAB — MICROALBUMIN / CREATININE URINE RATIO
Creatinine, Urine: 64.3 mg/dL
Microalb/Creat Ratio: 51 mg/g{creat} — ABNORMAL HIGH (ref 0–29)
Microalbumin, Urine: 33 ug/mL

## 2023-11-20 LAB — LIPID PANEL
Chol/HDL Ratio: 3.9 ratio (ref 0.0–5.0)
Cholesterol, Total: 118 mg/dL (ref 100–199)
HDL: 30 mg/dL — ABNORMAL LOW (ref >39)
LDL Chol Calc (NIH): 67 mg/dL (ref 0–99)
Triglycerides: 114 mg/dL (ref 0–149)
VLDL Cholesterol Cal: 21 mg/dL (ref 5–40)

## 2023-11-20 LAB — HEMOGLOBIN A1C
Est. average glucose Bld gHb Est-mCnc: 232 mg/dL
Hgb A1c MFr Bld: 9.7 % — ABNORMAL HIGH (ref 4.8–5.6)

## 2023-11-22 ENCOUNTER — Ambulatory Visit: Payer: Self-pay | Admitting: Family Medicine

## 2023-11-25 ENCOUNTER — Telehealth: Payer: Self-pay

## 2023-11-25 NOTE — Telephone Encounter (Signed)
 Copied from CRM [id number]. Topic: Clinical - Lab/Test Results >> Nov 25, 2023  1:57 PM Ethelle Herb L wrote: Reason for CRM: Pt given results. No further questions.

## 2023-11-27 ENCOUNTER — Encounter: Payer: Self-pay | Admitting: Family Medicine

## 2023-12-04 ENCOUNTER — Encounter: Payer: Self-pay | Admitting: Podiatry

## 2023-12-04 ENCOUNTER — Ambulatory Visit: Payer: Self-pay | Admitting: Podiatry

## 2023-12-04 DIAGNOSIS — D2371 Other benign neoplasm of skin of right lower limb, including hip: Secondary | ICD-10-CM

## 2023-12-04 DIAGNOSIS — D2372 Other benign neoplasm of skin of left lower limb, including hip: Secondary | ICD-10-CM

## 2023-12-04 DIAGNOSIS — M79674 Pain in right toe(s): Secondary | ICD-10-CM | POA: Diagnosis not present

## 2023-12-04 DIAGNOSIS — M79675 Pain in left toe(s): Secondary | ICD-10-CM

## 2023-12-04 DIAGNOSIS — B351 Tinea unguium: Secondary | ICD-10-CM | POA: Diagnosis not present

## 2023-12-04 DIAGNOSIS — M2041 Other hammer toe(s) (acquired), right foot: Secondary | ICD-10-CM

## 2023-12-04 DIAGNOSIS — E1142 Type 2 diabetes mellitus with diabetic polyneuropathy: Secondary | ICD-10-CM | POA: Diagnosis not present

## 2023-12-04 DIAGNOSIS — M2042 Other hammer toe(s) (acquired), left foot: Secondary | ICD-10-CM

## 2023-12-04 NOTE — Progress Notes (Signed)
 Subjective:  Patient ID: Jeffrey Solomon, male    DOB: 05-May-1961,  MRN: [id number] HPI Chief Complaint  Patient presents with   Diabetes    Diabetic foot exam - last A1c was 9.7, would like a new prescription for diabetic shoes and insoles   New Patient (Initial Visit)    63 y.o. male presents with the above complaint.   ROS: Denies fever chills nausea vomiting muscle aches pains calf pain back pain chest pain shortness of breath.  Past Medical History:  Diagnosis Date   Diabetes mellitus without complication (HCC)    Hypertension    No past surgical history on file.  Current Outpatient Medications:    aspirin 81 MG chewable tablet, Chew by mouth., Disp: , Rfl:    atorvastatin  (LIPITOR) 80 MG tablet, Take 1 tablet (80 mg total) by mouth daily., Disp: 90 tablet, Rfl: 0   cyclobenzaprine (FLEXERIL) 5 MG tablet, TAKE ONE TO TWO TABLETS BY MOUTH TWICE DAILY AS NEEDED FOR MUSCLE SPASMS, Disp: , Rfl:    dapagliflozin  propanediol (FARXIGA ) 10 MG TABS tablet, Take 1 tablet (10 mg total) by mouth daily before breakfast., Disp: 30 tablet, Rfl: 3   famotidine  (PEPCID ) 20 MG tablet, Take 1 tablet (20 mg total) by mouth 2 (two) times daily. (Patient not taking: Reported on 08/19/2023), Disp: 60 tablet, Rfl: 0   fluticasone  (FLONASE ) 50 MCG/ACT nasal spray, Place 2 sprays into both nostrils daily., Disp: 18.2 mL, Rfl: 3   furosemide  (LASIX ) 40 MG tablet, Take 1 tablet (40 mg total) by mouth daily., Disp: 30 tablet, Rfl: 1   glipiZIDE  (GLUCOTROL ) 5 MG tablet, Take 1 tablet (5 mg total) by mouth 2 (two) times daily before a meal., Disp: 60 tablet, Rfl: 3   hydrochlorothiazide  (HYDRODIURIL ) 25 MG tablet, Take 1 tablet (25 mg total) by mouth daily., Disp: 30 tablet, Rfl: 0   levothyroxine (SYNTHROID) 25 MCG tablet, Take by mouth. (Patient not taking: Reported on 08/19/2023), Disp: , Rfl:    metoprolol  succinate (TOPROL -XL) 50 MG 24 hr tablet, Take 1 tablet (50 mg total) by mouth daily. Take with or  immediately following a meal., Disp: 90 tablet, Rfl: 3   pregabalin (LYRICA) 150 MG capsule, Take 150 mg by mouth 3 (three) times daily., Disp: , Rfl:    Semaglutide ,0.25 or 0.5MG /DOS, (OZEMPIC , 0.25 OR 0.5 MG/DOSE,) 2 MG/1.5ML SOPN, Inject 0.25 mg subq qwk for 4 wk then inject 0.5 mg subq qwk x4 wk, then go to full dose pen qwk, Disp: 3 mL, Rfl: 1   simethicone (MYLICON) 125 MG chewable tablet, Chew by mouth. (Patient not taking: Reported on 11/19/2023), Disp: , Rfl:    ustekinumab (STELARA) 90 MG/ML SOSY injection, Inject 90 mg into the skin every 8 (eight) weeks. (Patient not taking: Reported on 11/19/2023), Disp: , Rfl:   Allergies  Allergen Reactions   Gramineae Pollens     Allergic to everything outside, per pt;  Sneeze, runny nose   Review of Systems Objective:  There were no vitals filed for this visit.  General: Well developed, nourished, in no acute distress, alert and oriented x3   Dermatological: Skin is warm, dry and supple bilateral. Nails x 10 are long thick dystrophic; remaining integument appears unremarkable at this time. There are no open sores, no preulcerative lesions, no rash or signs of infection present.  Reactive hyperkeratotic lesions to the medial aspect of the metatarsal phalangeal joint first and interphalangeal joint of the hallux.  This is bilaterally left is worse these  are preulcerative in nature was debrided today and the necrotic tissue was resected with viable tissue beneath it..  Vascular: Dorsalis Pedis artery and Posterior Tibial artery pedal pulses are 2/4 bilateral with immedate capillary fill time. Pedal hair growth present. No varicosities and no lower extremity edema present bilateral.   Neruologic: Grossly intact via light touch bilateral. Vibratory intact via tuning fork bilateral. Protective threshold with Semmes Wienstein monofilament diminished fifth toes bilateral to all pedal sites bilateral. Patellar and Achilles deep tendon reflexes 2+  bilateral. No Babinski or clonus noted bilateral.   Musculoskeletal: No gross boney pedal deformities bilateral. No pain, crepitus, or limitation noted with foot and ankle range of motion bilateral. Muscular strength 5/5 in all groups tested bilateral.  Hammertoe deformities bilateral hallux valgus deformities bilateral left greater than right.  Gait: Unassisted, Nonantalgic.    Radiographs:  None taken  Assessment & Plan:   Assessment: Diabetes mellitus with diabetic peripheral neuropathy digital deformities preulcerative lesions and painful elongated toenails.  Plan: Debridement of benign skin lesions debridement of calluses bilateral referred him to [facility] for diabetic shoes.     Jeffrey Solomon, [place]

## 2023-12-05 ENCOUNTER — Ambulatory Visit (INDEPENDENT_AMBULATORY_CARE_PROVIDER_SITE_OTHER): Admitting: Family Medicine

## 2023-12-05 ENCOUNTER — Encounter: Payer: Self-pay | Admitting: Family Medicine

## 2023-12-05 VITALS — BP 125/82 | HR 52 | Temp 97.5°F | Resp 18 | Ht 70.5 in | Wt 262.0 lb

## 2023-12-05 DIAGNOSIS — E118 Type 2 diabetes mellitus with unspecified complications: Secondary | ICD-10-CM

## 2023-12-05 DIAGNOSIS — I10 Essential (primary) hypertension: Secondary | ICD-10-CM

## 2023-12-05 MED ORDER — LISINOPRIL 2.5 MG PO TABS: 2.5000 mg | ORAL_TABLET | Freq: Every day | ORAL | 1 refills | Status: DC

## 2023-12-05 NOTE — Progress Notes (Signed)
   Established Patient Office Visit  Subjective   Patient ID: Jeffrey Solomon, male    DOB: 1960-11-14  Age: 63 y.o. MRN: [id number]  Chief Complaint  Patient presents with   Medical Management of Chronic Issues    HPI Delightful 63 year old with HTN, untreated OSA, ischemic CVA, DMT2, obesity, ulcerative colitis, thyroid cancer (s/p thyroid lobectomy), left hemidiaphragm elevation, Hx EtOH misuse (in remission), s/p UU plasty, Hx hypoxia/hypercapnia (09/15/2023 hospitalized for volume overload and hypoventilation 09/16/2023 TTE: HFpEF with EF greater than 55%, LV wall thickness undetermined, diastolic function undetermined was offered NIPPF at discharge but declined. He got his sleep study but the results are not yet available.   He did not get his Ozempic  prescription.  He got the PA for Ozempic .  Called the pharmacy and they will fill his Ozempic  today.  He is to take the Farxiga  and glipizide  5mg  BID with the Ozempic .   He has slight increase in [facility] at 51 on 11/19/2023 labs.  He is not taking an ACEI or ARB.  Discussed low dose lisinopril.   He has forms from the podiatrist that have to completed so he can get custom insoles for his shoes.      ROS    Objective:     BP 125/82 (BP Location: Left Arm, Patient Position: Sitting, Cuff Size: Normal)   Pulse (!) 52   Temp (!) 97.5 F (36.4 C) (Oral)   Resp 18   Ht 5' 10.5 (1.791 m)   Wt 262 lb (118.8 kg)   SpO2 90%   BMI 37.06 kg/m    Physical Exam Vitals reviewed.  Constitutional:      Appearance: Normal appearance.  HENT:     Head: Normocephalic.   Eyes:     General:        Right eye: No discharge.        Left eye: No discharge.    Cardiovascular:     Rate and Rhythm: Normal rate.  Pulmonary:     Effort: Pulmonary effort is normal.   Neurological:     Mental Status: He is alert and oriented to person, place, and time.   Psychiatric:        Mood and Affect: Mood normal.        Behavior: Behavior normal.         Thought Content: Thought content normal.        Judgment: Judgment normal.          No results found for any visits on 12/05/23.    The ASCVD Risk score (Arnett DK, et al., 2019) failed to calculate for the following reasons:   Risk score cannot be calculated because patient has a medical history suggesting prior/existing ASCVD    Assessment & Plan:  Type 2 diabetes with complication [facility]) Assessment & Plan: Starting Ozempic  for DMT2 control and to help him loose weight.  Explained how to inject Ozempic . Has mildly elevated UMACR.  Start lisinopril 2.5mg  daily.  FOLLOW-UP in 4 weeks for symptoms check.    Orders: -     Lisinopril; Take 1 tablet (2.5 mg total) by mouth daily.  Dispense: 90 tablet; Refill: 1  Hypertension, benign Assessment & Plan: Well controlled on metoprolol  succinate 50mg  daily.  Adding lisinopril 2.5mg  daily.  Will recheck BP in a month      Return in about 4 weeks (around 01/02/2024).    Ambyr Qadri K Joscelyne Renville, MD

## 2023-12-05 NOTE — Assessment & Plan Note (Addendum)
 Starting Ozempic  for DMT2 control and to help him loose weight.  Explained how to inject Ozempic . Has mildly elevated UMACR.  Start lisinopril 2.5mg  daily.  FOLLOW-UP in 4 weeks for symptoms check.

## 2023-12-05 NOTE — Assessment & Plan Note (Addendum)
 Well controlled on metoprolol  succinate 50mg  daily.  Adding lisinopril 2.5mg  daily.  Will recheck BP in a month

## 2023-12-10 DIAGNOSIS — G4733 Obstructive sleep apnea (adult) (pediatric): Secondary | ICD-10-CM | POA: Diagnosis not present

## 2023-12-11 ENCOUNTER — Ambulatory Visit: Payer: Self-pay

## 2023-12-11 DIAGNOSIS — G4733 Obstructive sleep apnea (adult) (pediatric): Secondary | ICD-10-CM

## 2023-12-11 NOTE — Telephone Encounter (Signed)
-----   Message from [facility] D REDDY sent at 12/11/2023  3:23 PM EDT ----- Regarding: HST results Please notify patient that HST revealed moderate OSA, recommend proceeding with APAP therapy set to 4-16 cm H2O, EPR 3 with the Airtouch N30i nasal mask. Please also schedule a 3 month follow up  visit if patient wishes to proceed with CPAP therapy. Thanks     ----- Message ----- From: Lynette Saras Sent: 12/10/2023   5:22 PM EDT To: Pallavi D Reddy, MD

## 2023-12-13 ENCOUNTER — Telehealth: Payer: Self-pay | Admitting: Podiatry

## 2023-12-13 NOTE — Telephone Encounter (Signed)
 Patient stated the address on paper that he received from provider regarding diabetic shoe in [place] was incorrect, he eventually was given the correct information from someone, and the shoe store stated he would receive a call and he never heard back from them. Patient would like for Dr. Lara Plants to get in contact with shoe store regarding diabetic shoes or contact patient at [phone]

## 2023-12-16 ENCOUNTER — Other Ambulatory Visit: Payer: Self-pay | Admitting: Family Medicine

## 2023-12-16 DIAGNOSIS — E118 Type 2 diabetes mellitus with unspecified complications: Secondary | ICD-10-CM

## 2023-12-17 ENCOUNTER — Ambulatory Visit: Admitting: Family Medicine

## 2023-12-19 ENCOUNTER — Other Ambulatory Visit: Payer: Self-pay

## 2023-12-19 DIAGNOSIS — E118 Type 2 diabetes mellitus with unspecified complications: Secondary | ICD-10-CM

## 2023-12-19 DIAGNOSIS — M5136 Other intervertebral disc degeneration, lumbar region with discogenic back pain only: Secondary | ICD-10-CM

## 2023-12-19 DIAGNOSIS — E782 Mixed hyperlipidemia: Secondary | ICD-10-CM

## 2023-12-19 MED ORDER — CYCLOBENZAPRINE HCL 5 MG PO TABS: ORAL_TABLET | ORAL | 0 refills | Status: DC

## 2023-12-19 MED ORDER — ATORVASTATIN CALCIUM 80 MG PO TABS
80.0000 mg | ORAL_TABLET | Freq: Every day | ORAL | 0 refills | Status: DC
Start: 2023-12-19 — End: 2024-01-24

## 2023-12-19 MED ORDER — DAPAGLIFLOZIN PROPANEDIOL 10 MG PO TABS: 10.0000 mg | ORAL_TABLET | Freq: Every day | ORAL | 1 refills | Status: AC

## 2024-01-06 ENCOUNTER — Encounter: Payer: Self-pay | Admitting: Family Medicine

## 2024-01-06 ENCOUNTER — Telehealth: Payer: Self-pay | Admitting: Family Medicine

## 2024-01-06 ENCOUNTER — Ambulatory Visit (INDEPENDENT_AMBULATORY_CARE_PROVIDER_SITE_OTHER): Admitting: Family Medicine

## 2024-01-06 VITALS — BP 134/74 | HR 63 | Temp 98.1°F | Resp 20 | Ht 70.5 in | Wt 258.0 lb

## 2024-01-06 DIAGNOSIS — D508 Other iron deficiency anemias: Secondary | ICD-10-CM

## 2024-01-06 DIAGNOSIS — I10 Essential (primary) hypertension: Secondary | ICD-10-CM

## 2024-01-06 DIAGNOSIS — E1169 Type 2 diabetes mellitus with other specified complication: Secondary | ICD-10-CM

## 2024-01-06 DIAGNOSIS — E782 Mixed hyperlipidemia: Secondary | ICD-10-CM

## 2024-01-06 DIAGNOSIS — G4733 Obstructive sleep apnea (adult) (pediatric): Secondary | ICD-10-CM

## 2024-01-06 MED ORDER — OZEMPIC (0.25 OR 0.5 MG/DOSE) 2 MG/1.5ML ~~LOC~~ SOPN: PEN_INJECTOR | SUBCUTANEOUS | 1 refills | Status: DC

## 2024-01-06 NOTE — Progress Notes (Signed)
 Established Patient Office Visit  Subjective   Patient ID: Jeffrey Solomon, male    DOB: 1960-07-12  Age: 63 y.o. MRN: [id number]  Chief Complaint  Patient presents with   Medical Management of Chronic Issues    HPI Delightful 63 year old with HTN, OSA (now on CPAP), ischemic CVA, DMT2 (11/19/23 A1c 9.7%, UMACR 51), obesity, UC, thyroid cancer (s/p thyroid lobectomy), left hemidiaphragm elevation, Hx EtOH misuse (in remission), s/p UUU plasty, Hx hypoxia/hypercapnia (09/15/2023 hospitalized for volume overload and hypoventilation 09/16/2023 TTE: HFpEF with EF greater than 55%, LV wall thickness undetermined, diastolic function undetermined).  Discussed the use of AI scribe software for clinical note transcription with the patient, who gave verbal consent to proceed.  He has difficulty with self-administering his Crohn's disease medication, which requires injection at a 45-degree angle every eight weeks. Bruising occurs at the injection site, and he feels discomfort with self-injection. Despite administering Ozempic  weekly for the past four weeks, he has not experienced significant weight loss. No nausea or constipation is reported, although he wears diapers due to diarrhea associated with Crohn's disease.  He recently started taking lisinopril  2.5 mg for diabetes renal protection and reports no issues with this medication. He also takes an 81 mg aspirin.  He describes confusion in obtaining new shoes and inserts due to diabetes. Directed by Podiatry to an incorrect address, he has not received an appointment for fitting, despite being entitled to new shoes and inserts annually. He reports that the podiatrist provided paperwork after his last visit and that no follow-up appointment has been scheduled.  He is undergoing dental work, having had three teeth extracted and planning to have the remaining top teeth removed for a partial denture. He has a permission form for the dentist for me to  complete.  He mentions financial concerns regarding the cost of temporary dentures.  He recently received a CPAP machine and has been using it for the past two to three weeks, noting some improvement in how he feels upon waking, although he experiences dry mouth.   He has not been checking his blood sugars and has not experienced low blood sugar symptoms.  He really doesn't want to check his BS if he does not need to.  He mentions a history of a stroke in February 2021 and has been taking iron supplements since March 2021.   He expresses confusion about his insurance coverage and the process for obtaining medical supplies and services.       ROS    Objective:     BP 134/74 (BP Location: Left Arm, Patient Position: Sitting, Cuff Size: Normal)   Pulse 63   Temp 98.1 F (36.7 C) (Oral)   Resp 20   Ht 5' 10.5 (1.791 m)   Wt 258 lb (117 kg)   SpO2 92%   BMI 36.50 kg/m    Physical Exam Vitals and nursing note reviewed.  Constitutional:      Appearance: Normal appearance.  HENT:     Head: Normocephalic and atraumatic.  Eyes:     Conjunctiva/sclera: Conjunctivae normal.  Cardiovascular:     Rate and Rhythm: Normal rate and regular rhythm.     Pulses:          Dorsalis pedis pulses are 1+ on the right side and 2+ on the left side.  Pulmonary:     Effort: Pulmonary effort is normal.     Breath sounds: Normal breath sounds.  Musculoskeletal:     Right lower leg: No  edema.     Left lower leg: No edema.  Feet:     Right foot:     Protective Sensation: 5 sites tested.  5 sites sensed.     Skin integrity: Callus present.     Toenail Condition: Right toenails are normal.     Left foot:     Protective Sensation: 5 sites tested.  4 sites sensed.     Skin integrity: Callus present.     Toenail Condition: Left toenails are normal.  Skin:    General: Skin is warm and dry.  Neurological:     Mental Status: He is alert and oriented to person, place, and time.  Psychiatric:         Mood and Affect: Mood normal.        Behavior: Behavior normal.        Thought Content: Thought content normal.        Judgment: Judgment normal.          No results found for any visits on 01/06/24.    The ASCVD Risk score (Arnett DK, et al., 2019) failed to calculate for the following reasons:   Risk score cannot be calculated because patient has a medical history suggesting prior/existing ASCVD    Assessment & Plan:  Iron deficiency anemia secondary to inadequate dietary iron intake Assessment & Plan: He has been taking iron for several years.  Will check his iron indices and CBC  Orders: -     Iron, TIBC and Ferritin Panel -     CBC with Differential/Platelet  Primary hypertension -     Comprehensive metabolic panel with GFR  Type 2 diabetes mellitus with other specified complication, without long-term current use of insulin (HCC) -     Microalbumin / creatinine urine ratio -     Ozempic  (0.25 or 0.5 MG/DOSE); Inject 0.25 mg subq qwk for 4 wk then inject 0.5 mg subq qwk x4 wk, then go to full dose pen qwk  Dispense: 3 mL; Refill: 1  Mixed hyperlipidemia -     Lipid panel  OSA (obstructive sleep apnea) Assessment & Plan: Got his CPAP and seems to be doing well.     Assessment and Plan    Crohn's Disease Chronic condition with ongoing diarrhea. He self-administers medication every eight weeks but reports difficulty with injections leading to bruising. - Under the care of GI  Type 2 Diabetes Mellitus On Ozempic  for diabetes management and weight loss. Recently started with no significant side effects such as nausea or constipation. No weight loss yet, but anticipated with continued use. Dose increase expected to improve efficacy. - Since your last injection was almost 2 weeks ago, give yourself Ozempic  0.25mg  this week and then next week increase to Ozempic  0.5mg  weekly for 4 weeks.  -Continue Farxiga  and Glipizide  5mg  BID  Foot Care and Diabetic  Footwear Experiencing issues obtaining diabetic shoes and inserts due to address and contact confusion. Entitled to new shoes and inserts for free due to diabetic status. - Contact podiatrist to clarify process for obtaining footwear - Follow up with medical supply house to ensure paperwork is processed  Hypertension On lisinopril  2.5 mg for blood pressure management. Blood pressure control essential. Kidney function will be checked to ensure tolerance. - Continue lisinopril  2.5 mg daily - Check kidney function and UMACR  Obstructive Sleep Apnea Recently started using CPAP machine with improved sleep quality. Experiences dry mouth but feels slightly better overall. - Continue using CPAP machine nightly  Dental Health Requires dental extractions and partial denture. Financial concern regarding temporary dentures. On aspirin, which may affect bleeding during procedures. - Completed form for Dentist and copy on chart.  Alerted dentist that patient takes ASA daily - Discuss aspirin use with dental provider to manage bleeding risk  General Health Maintenance On aspirin 81 mg for cardiovascular protection. Aware of the importance of regular blood sugar monitoring. - Continue aspirin 81 mg daily - Encourage regular blood sugar monitoring      Return in about 4 weeks (around 02/03/2024).    Parthiv Mucci K Evynn Boutelle, MD

## 2024-01-06 NOTE — Assessment & Plan Note (Signed)
 Got his CPAP and seems to be doing well.

## 2024-01-06 NOTE — Assessment & Plan Note (Signed)
 He has been taking iron for several years.  Will check his iron indices and CBC

## 2024-01-06 NOTE — Telephone Encounter (Unsigned)
 Copied from CRM [id number]. Topic: General - Billing Inquiry >> Jan 06, 2024 12:24 PM Tiffini S wrote: Reason for CRM: Patient received a bill for $184 from [facility] for  Diabetic Foot Evaluation/ patient normally pay $4/ he suppose to get new shoes and inserts. Offered to transfer the patient to the billing department. Patient refused because the referral came from Dr. Ziglar. Patient is asking for explanation why provider referred to doctor. Patient is asking for a call back at [phone].

## 2024-01-07 ENCOUNTER — Ambulatory Visit: Payer: Self-pay | Admitting: Family Medicine

## 2024-01-07 DIAGNOSIS — E118 Type 2 diabetes mellitus with unspecified complications: Secondary | ICD-10-CM

## 2024-01-07 LAB — CBC WITH DIFFERENTIAL/PLATELET
Basophils Absolute: 0.1 x10E3/uL (ref 0.0–0.2)
Basos: 2 %
EOS (ABSOLUTE): 0.4 x10E3/uL (ref 0.0–0.4)
Eos: 5 %
Hematocrit: 50.7 % (ref 37.5–51.0)
Hemoglobin: 16.1 g/dL (ref 13.0–17.7)
Immature Grans (Abs): 0 x10E3/uL (ref 0.0–0.1)
Immature Granulocytes: 0 %
Lymphocytes Absolute: 2 x10E3/uL (ref 0.7–3.1)
Lymphs: 24 %
MCH: 29.2 pg (ref 26.6–33.0)
MCHC: 31.8 g/dL (ref 31.5–35.7)
MCV: 92 fL (ref 79–97)
Monocytes Absolute: 0.7 x10E3/uL (ref 0.1–0.9)
Monocytes: 8 %
Neutrophils Absolute: 5.2 x10E3/uL (ref 1.4–7.0)
Neutrophils: 61 %
Platelets: 283 x10E3/uL (ref 150–450)
RBC: 5.51 x10E6/uL (ref 4.14–5.80)
RDW: 13.4 % (ref 11.6–15.4)
WBC: 8.5 x10E3/uL (ref 3.4–10.8)

## 2024-01-07 LAB — LIPID PANEL
Chol/HDL Ratio: 3.7 ratio (ref 0.0–5.0)
Cholesterol, Total: 131 mg/dL (ref 100–199)
HDL: 35 mg/dL — ABNORMAL LOW (ref >39)
LDL Chol Calc (NIH): 73 mg/dL (ref 0–99)
Triglycerides: 125 mg/dL (ref 0–149)
VLDL Cholesterol Cal: 23 mg/dL (ref 5–40)

## 2024-01-07 LAB — COMPREHENSIVE METABOLIC PANEL WITH GFR
ALT: 27 IU/L (ref 0–44)
AST: 20 IU/L (ref 0–40)
Albumin: 4.5 g/dL (ref 3.9–4.9)
Alkaline Phosphatase: 89 IU/L (ref 44–121)
BUN/Creatinine Ratio: 16 (ref 10–24)
BUN: 15 mg/dL (ref 8–27)
Bilirubin Total: 0.7 mg/dL (ref 0.0–1.2)
CO2: 24 mmol/L (ref 20–29)
Calcium: 9.7 mg/dL (ref 8.6–10.2)
Chloride: 100 mmol/L (ref 96–106)
Creatinine, Ser: 0.95 mg/dL (ref 0.76–1.27)
Globulin, Total: 3 g/dL (ref 1.5–4.5)
Glucose: 116 mg/dL — ABNORMAL HIGH (ref 70–99)
Potassium: 4.6 mmol/L (ref 3.5–5.2)
Sodium: 143 mmol/L (ref 134–144)
Total Protein: 7.5 g/dL (ref 6.0–8.5)
eGFR: 90 mL/min/1.73 (ref >59)

## 2024-01-07 LAB — IRON,TIBC AND FERRITIN PANEL
Ferritin: 199 ng/mL (ref 30–400)
Iron Saturation: 57 % — ABNORMAL HIGH (ref 15–55)
Iron: 237 ug/dL — ABNORMAL HIGH (ref 38–169)
Total Iron Binding Capacity: 418 ug/dL (ref 250–450)
UIBC: 181 ug/dL (ref 111–343)

## 2024-01-07 LAB — MICROALBUMIN / CREATININE URINE RATIO
Creatinine, Urine: 28.1 mg/dL
Microalb/Creat Ratio: <11 mg/g{creat} (ref 0–29)
Microalbumin, Urine: <3 ug/mL

## 2024-01-07 MED ORDER — LISINOPRIL 2.5 MG PO TABS: 2.5000 mg | ORAL_TABLET | Freq: Every day | ORAL | 1 refills | Status: AC

## 2024-01-08 ENCOUNTER — Telehealth: Payer: Self-pay

## 2024-01-08 NOTE — Telephone Encounter (Signed)
 Copied from CRM [id number]. Topic: General - Other >> Jan 08, 2024 11:41 AM Rosaria BRAVO wrote: Reason for CRM: Natasha from provider's office Medical clearance not completed. Needs this completed and faxed back. This is for [facility]   Fax: [phone]   Best contact: [phone]

## 2024-01-08 NOTE — Telephone Encounter (Signed)
 Spoke with Mylinda who indicated that 1st question was missing an answer. Form has been corrected and faxed back

## 2024-01-09 ENCOUNTER — Telehealth: Payer: Self-pay | Admitting: Family Medicine

## 2024-01-09 NOTE — Telephone Encounter (Signed)
 Called patient and discussed his bill.  Advised that he needs to talk to the Podiatrist office about getting his diabetic shoes and inserts.

## 2024-01-09 NOTE — Telephone Encounter (Signed)
 Spoke with patient about his bill.  Needs to tal to the billing department.  Needs to call the podiatrist to find out what he needs to do to get his diabetic shoes and inserts,

## 2024-01-16 ENCOUNTER — Other Ambulatory Visit: Payer: Self-pay | Admitting: Family Medicine

## 2024-01-16 DIAGNOSIS — I5032 Chronic diastolic (congestive) heart failure: Secondary | ICD-10-CM

## 2024-01-16 NOTE — Telephone Encounter (Signed)
 Error

## 2024-01-23 ENCOUNTER — Other Ambulatory Visit: Payer: Self-pay | Admitting: Family Medicine

## 2024-01-23 DIAGNOSIS — E782 Mixed hyperlipidemia: Secondary | ICD-10-CM

## 2024-01-23 DIAGNOSIS — T7840XS Allergy, unspecified, sequela: Secondary | ICD-10-CM

## 2024-01-23 DIAGNOSIS — I5032 Chronic diastolic (congestive) heart failure: Secondary | ICD-10-CM

## 2024-01-23 MED ORDER — FUROSEMIDE 40 MG PO TABS: 40.0000 mg | ORAL_TABLET | Freq: Every day | ORAL | 3 refills | Status: AC

## 2024-01-23 MED ORDER — FLUTICASONE PROPIONATE 50 MCG/ACT NA SUSP: 2.0000 | Freq: Every day | NASAL | 3 refills | Status: AC

## 2024-01-23 NOTE — Telephone Encounter (Signed)
 Copied from CRM [id number]. Topic: Clinical - Medication Refill >> Jan 23, 2024 12:04 PM Montie POUR wrote: Medication: fluticasone  (FLONASE ) 50 MCG/ACT nasal spray   Has the patient contacted their pharmacy? Yes (Agent: If no, request that the patient contact the pharmacy for the refill. If patient does not wish to contact the pharmacy document the reason why and proceed with request.) (Agent: If yes, when and what did the pharmacy advise?) Pharmacy needs order to refill  This is the patient's preferred pharmacy:  [facility] - [place], [place] - [address] [place] [address] Phone: [phone] Fax: [phone]  Is this the correct pharmacy for this prescription? Yes If no, delete pharmacy and type the correct one.   Has the prescription been filled recently? No  Is the patient out of the medication? Yes  Has the patient been seen for an appointment in the last year OR does the patient have an upcoming appointment? Yes  Can we respond through MyChart? No  Agent: Please be advised that Rx refills may take up to 3 business days. We ask that you follow-up with your pharmacy.

## 2024-01-23 NOTE — Telephone Encounter (Signed)
 Copied from CRM [id number]. Topic: Clinical - Prescription Issue >> Jan 23, 2024 12:16 PM Ivette P wrote: Reason for CRM: Landen from pharmacy called in to get verbal order for medication for patients to prevent any further delay, called CAL but no answer due to being on lunch.    Pt is out of medication and needs ASAP furosemide  (LASIX ) 40 MG tablet fluticasone  (FLONASE ) 50 MCG/ACT nasal spray   Pls give pharmacy a call for verbal orders   [phone]

## 2024-01-30 ENCOUNTER — Ambulatory Visit: Payer: Self-pay

## 2024-01-30 NOTE — Telephone Encounter (Signed)
 FYI Only or Action Required?: FYI only for provider.  Patient was last seen in primary care on 01/06/2024 by Ziglar, Susan K, MD.  Called Nurse Triage reporting No chief complaint on file..  Symptoms began several days ago.  Interventions attempted: Rest, hydration, or home remedies.  Symptoms are: gradually worsening.  Triage Disposition: Go to ED Now (Notify PCP)  Patient/caregiver understands and will follow disposition?: Yes  Reason for Disposition  Black or tarry bowel movements  (Exception: Chronic-unchanged black-grey BMs AND is taking iron pills or Pepto-Bismol.)  Answer Assessment - Initial Assessment Questions 1. COLOR: What color is your stool? Is that color in part or all of the stool? (e.g., black, clay-colored, green, red)      Runny, black stools  2. ONSET: When did you first notice this color change?     days  3. CAUSE: Have you eaten any food or taken any medicine of this color? Note: See listing in Background Information section.      Hx of crohns, but black stool is a new symptom  4. OTHER SYMPTOMS: Do you have any other symptoms? (e.g., abdomen pain, diarrhea, fever, yellow eyes or skin).     Runny stools  Protocols used: Stools - Unusual Color-A-AH, Rectal Bleeding-A-AH

## 2024-01-30 NOTE — Telephone Encounter (Signed)
 First attempt: LVM for patient to return call to [phone]  Message from [place] T sent at 01/30/2024  9:48 AM EDT  Black runny stools since Tuesday, slight discomfort- [phone]

## 2024-01-31 ENCOUNTER — Ambulatory Visit: Admitting: Family Medicine

## 2024-01-31 ENCOUNTER — Telehealth: Payer: Self-pay | Admitting: Podiatry

## 2024-01-31 ENCOUNTER — Other Ambulatory Visit: Payer: Self-pay | Admitting: Lab

## 2024-01-31 DIAGNOSIS — E1142 Type 2 diabetes mellitus with diabetic polyneuropathy: Secondary | ICD-10-CM

## 2024-01-31 DIAGNOSIS — M79674 Pain in right toe(s): Secondary | ICD-10-CM

## 2024-01-31 NOTE — Telephone Encounter (Signed)
 Referral sent

## 2024-01-31 NOTE — Telephone Encounter (Signed)
 As per patient, Jeffrey Solomon is requesting to receive referral from provider for patient's shoes.

## 2024-03-09 ENCOUNTER — Other Ambulatory Visit: Payer: Self-pay | Admitting: Gastroenterology

## 2024-03-09 DIAGNOSIS — K529 Noninfective gastroenteritis and colitis, unspecified: Secondary | ICD-10-CM

## 2024-03-10 ENCOUNTER — Ambulatory Visit

## 2024-03-12 ENCOUNTER — Encounter: Payer: Self-pay | Admitting: Sleep Medicine

## 2024-03-12 ENCOUNTER — Ambulatory Visit: Admitting: Sleep Medicine

## 2024-03-12 VITALS — BP 118/68 | HR 56 | Temp 97.5°F | Ht 70.5 in | Wt 251.6 lb

## 2024-03-12 DIAGNOSIS — I10 Essential (primary) hypertension: Secondary | ICD-10-CM

## 2024-03-12 DIAGNOSIS — G4733 Obstructive sleep apnea (adult) (pediatric): Secondary | ICD-10-CM | POA: Diagnosis not present

## 2024-03-12 NOTE — Progress Notes (Signed)
 Jeffrey Solomon MRN: [id number] DOB: [birth year]   CHIEF COMPLAINT:  EXCESSIVE DAYTIME SLEEPINESS   HISTORY OF PRESENT ILLNESS: Jeffrey Solomon is a 63 y.o. w/ a h/o OSA s/p UP3, CVA, HTN, DMII, hyperlipiemia, obesity and hypothyroidism who presents for CPAP f/u visit. Reports using CPAP therapy every night, which is confirmed by compliance data. He is currently using the Airfit F30i FFM, which is comfortable. Reports intermittent air leaks. Overall reports feeling significantly more refreshed upon awakening with CPAP therapy.   EPWORTH SLEEP SCORE    09/25/2023    2:00 PM  Results of the Epworth flowsheet  Sitting and reading 0  Watching TV 3  Sitting, inactive in a public place (e.g. a theatre or a meeting) 0  As a passenger in a car for an hour without a break 0  Lying down to rest in the afternoon when circumstances permit 3  Sitting and talking to someone 0  Sitting quietly after a lunch without alcohol 3  In a car, while stopped for a few minutes in traffic 0  Total score 9    PAST MEDICAL HISTORY :   has a past medical history of Diabetes mellitus without complication (HCC) and Hypertension.  has no past surgical history on file. Prior to Admission medications   Medication Sig Start Date End Date Taking? Authorizing Provider  aspirin EC 81 MG tablet Take 81 mg by mouth daily. Swallow whole.   Yes [provider]  atorvastatin  (LIPITOR) 80 MG tablet TAKE ONE TABLET (80 MG TOTAL) BY MOUTH DAILY. 01/24/24  Yes Ziglar, Susan K, MD  cyclobenzaprine  (FLEXERIL ) 5 MG tablet TAKE ONE TO TWO TABLETS BY MOUTH TWICE DAILY AS NEEDED FOR MUSCLE SPASMS 12/19/23  Yes Ziglar, Susan K, MD  dapagliflozin  propanediol (FARXIGA ) 10 MG TABS tablet Take 1 tablet (10 mg total) by mouth daily before breakfast. 12/19/23  Yes Ziglar, Susan K, MD  famotidine  (PEPCID ) 20 MG tablet Take 1 tablet (20 mg total) by mouth 2 (two) times daily. 09/15/19  Yes Viviann Pastor, MD  ferrous  sulfate (FEROSUL) 325 (65 FE) MG tablet Take 325 mg by mouth daily with breakfast.   Yes [provider]  fluticasone  (FLONASE ) 50 MCG/ACT nasal spray Place 2 sprays into both nostrils daily. 01/23/24  Yes Ziglar, Susan K, MD  furosemide  (LASIX ) 40 MG tablet Take 1 tablet (40 mg total) by mouth daily. 01/23/24  Yes Ziglar, Susan K, MD  glipiZIDE  (GLUCOTROL ) 5 MG tablet Take 1 tablet (5 mg total) by mouth 2 (two) times daily before a meal. 08/19/23  Yes Ziglar, Susan K, MD  lisinopril  (ZESTRIL ) 2.5 MG tablet Take 1 tablet (2.5 mg total) by mouth daily. 01/07/24  Yes Ziglar, Susan K, MD  metoprolol  succinate (TOPROL -XL) 50 MG 24 hr tablet Take 1 tablet (50 mg total) by mouth daily. Take with or immediately following a meal. 08/20/23  Yes Ziglar, Susan K, MD  pregabalin (LYRICA) 150 MG capsule Take 150 mg by mouth 3 (three) times daily.   Yes [provider]  Semaglutide ,0.25 or 0.5MG /DOS, (OZEMPIC , 0.25 OR 0.5 MG/DOSE,) 2 MG/1.5ML SOPN Inject 0.25 mg subq qwk for 4 wk then inject 0.5 mg subq qwk x4 wk, then go to full dose pen qwk 01/06/24  Yes Ziglar, Susan K, MD  ustekinumab (STELARA) 90 MG/ML SOSY injection Inject 90 mg into the skin every 8 (eight) weeks. 11/05/23  Yes [provider]  hydrochlorothiazide  (HYDRODIURIL ) 25 MG tablet Take 1 tablet (25  mg total) by mouth daily. Patient not taking: Reported on 03/12/2024 09/15/19 12/05/23  Viviann Pastor, MD  levothyroxine (SYNTHROID) 25 MCG tablet Take by mouth. Patient not taking: Reported on 03/12/2024 01/01/22   [provider]  simethicone (MYLICON) 125 MG chewable tablet Chew by mouth. Patient not taking: Reported on 03/12/2024 06/29/21   [provider]  chlorthalidone  (HYGROTON ) 25 MG tablet Take 1 tablet (25 mg total) by mouth daily. 12/31/15 09/15/19  Ethyl Lenis, MD   Allergies  Allergen Reactions   Gramineae Pollens     Allergic to everything outside, per pt;  Sneeze, runny nose    FAMILY HISTORY:   family history is not on file. SOCIAL HISTORY:  reports that he has never smoked. He has never been exposed to tobacco smoke. He has never used smokeless tobacco. He reports that he does not currently use alcohol.   Review of Systems:  Gen:  Denies  fever, sweats, chills weight loss  HEENT: Denies blurred vision, double vision, ear pain, eye pain, hearing loss, nose bleeds, sore throat Cardiac:  No dizziness, chest pain or heaviness, chest tightness,edema, No JVD Resp:   No cough, -sputum production, -shortness of breath,-wheezing, -hemoptysis,  Gi: Denies swallowing difficulty, stomach pain, nausea or vomiting, diarrhea, constipation, bowel incontinence Gu:  Denies bladder incontinence, burning urine Ext:   Denies Joint pain, stiffness or swelling Skin: Denies  skin rash, easy bruising or bleeding or hives Endoc:  Denies polyuria, polydipsia , polyphagia or weight change Psych:   Denies depression, insomnia or hallucinations  Other:  All other systems negative  VITAL SIGNS: BP 118/68   Pulse (!) 56   Temp (!) 97.5 F (36.4 C)   Ht 5' 10.5 (1.791 m)   Wt 251 lb 9.6 oz (114.1 kg)   SpO2 96%   BMI 35.59 kg/m    Physical Examination:   General Appearance: No distress  EYES PERRLA, EOM intact.   NECK Supple, No JVD Pulmonary: normal breath sounds, No wheezing.  CardiovascularNormal S1,S2.  No m/r/g.   Abdomen: Benign, Soft, non-tender. Skin:   warm, no rashes, no ecchymosis  Extremities: normal, no cyanosis, clubbing. Neuro:without focal findings,  speech normal  PSYCHIATRIC: Mood, affect within normal limits.   ASSESSMENT AND PLAN  OSA Patient is using and benefiting from CPAP therapy. Discussed the consequences of untreated sleep apnea. Advised not to drive drowsy for safety of patient and others. Will follow up in 6 months.     HTN Stable, on current management. Following with PCP.    Patient  satisfied with Plan of action and management. All questions  answered  I spent a total of 25 minutes reviewing chart data, face-to-face evaluation with the patient, counseling and coordination of care as detailed above.    Jeffrey Solomon, M.D.  Sleep Medicine [facility]

## 2024-03-12 NOTE — Patient Instructions (Addendum)

## 2024-03-17 ENCOUNTER — Ambulatory Visit
Admission: RE | Admit: 2024-03-17 | Discharge: 2024-03-17 | Disposition: A | Discharge status: Home / Self Care | Source: Ambulatory Visit | Attending: Family Medicine | Admitting: Family Medicine

## 2024-03-17 DIAGNOSIS — E210 Primary hyperparathyroidism: Secondary | ICD-10-CM | POA: Diagnosis present

## 2024-03-18 ENCOUNTER — Ambulatory Visit: Payer: Self-pay | Admitting: Family Medicine

## 2024-03-18 ENCOUNTER — Telehealth: Payer: Self-pay

## 2024-03-18 ENCOUNTER — Ambulatory Visit: Admission: RE | Admit: 2024-03-18 | Payer: MEDICAID | Source: Ambulatory Visit

## 2024-03-18 NOTE — Telephone Encounter (Signed)
 Patient was provided results of Dexa scan and patient reported 7/10 back pain. Patient is requesting medication to help alleviate pain.

## 2024-03-20 ENCOUNTER — Ambulatory Visit (INDEPENDENT_AMBULATORY_CARE_PROVIDER_SITE_OTHER): Admitting: Family Medicine

## 2024-03-20 ENCOUNTER — Encounter: Payer: Self-pay | Admitting: Family Medicine

## 2024-03-20 VITALS — BP 118/83 | HR 58 | Temp 97.9°F | Resp 18 | Ht 70.5 in | Wt 255.0 lb

## 2024-03-20 DIAGNOSIS — M5136 Other intervertebral disc degeneration, lumbar region with discogenic back pain only: Secondary | ICD-10-CM | POA: Diagnosis not present

## 2024-03-20 DIAGNOSIS — E118 Type 2 diabetes mellitus with unspecified complications: Secondary | ICD-10-CM

## 2024-03-20 LAB — POCT GLYCOSYLATED HEMOGLOBIN (HGB A1C): Hemoglobin A1C: 6.5 % — AB (ref 4.0–5.6)

## 2024-03-20 MED ORDER — CYCLOBENZAPRINE HCL 5 MG PO TABS: ORAL_TABLET | ORAL | 0 refills | Status: AC

## 2024-03-20 MED ORDER — GABAPENTIN 300 MG PO CAPS: 300.0000 mg | ORAL_CAPSULE | Freq: Three times a day (TID) | ORAL | 3 refills | Status: DC

## 2024-03-20 NOTE — Assessment & Plan Note (Signed)
 Minimal symptoms today.  Has stopped going to the back doctor in the last 2 years.  Was previously treated with cyclobenzaprine  5 mg 3 times daily and Lyrica.  Will try gabapentin  300 mg 3 times daily and see how he does.  Follow back up in a month.

## 2024-03-20 NOTE — Progress Notes (Signed)
 Established Patient Office Visit  Subjective   Patient ID: ARTHER HEISLER, male    DOB: 05/04/1961  Age: 63 y.o. MRN: [id number]  Chief Complaint  Patient presents with   Back Pain    Back Pain   Delightful 63 year old with HTN, OSA (now on CPAP), ischemic CVA, DMT2 (11/19/23 A1c 9.7%, UMACR 51), obesity, UC, thyroid cancer (s/p thyroid lobectomy, papillary CA with follicular variant), left hemidiaphragm elevation, Hx EtOH misuse (in remission), s/p UUU plasty, Hx hypoxia/hypercapnia (09/15/2023 hospitalized for volume overload and hypoventilation 09/16/2023 TTE: HFpEF with EF greater than 55%, LV wall thickness undetermined, diastolic function undetermined).   Discussed the use of AI scribe software for clinical note transcription with the patient, who gave verbal consent to proceed.  History of Present Illness   Jeffrey Solomon is a 63 year old male with chronic back pain who presents with worsening lower back pain and numbness in the right leg.  He has been experiencing lower back pain that occasionally radiates to his right leg and hip, causing numbness and a sensation of losing balance. This issue has been intermittent for the past five years, with periods of relief lasting up to a year followed by months of daily pain. He mentions that it does not require daily pain medication, only two to three times a week.  He has previously consulted a back specialist in [facility] but discontinued visits two years ago due to lack of effective treatment. He has tried various medications, including a blue pill prescribed by the back specialist, which he found ineffective for his back pain. He has not tried gabapentin  before and is unsure if he has taken Lyrica.  He reports that previous x-rays of his back showed something like bulging discs or swelling, as he recalls being told. He experiences cramps in his back and legs, which sometimes require him to stand and move to alleviate the discomfort.  He has been prescribed cyclobenzaprine  in the past, which helped with cramps but not with back pain.  He also has sleep apnea and uses a CPAP machine, which has improved his sleep quality but not his back pain. He has Crohn's disease and receives a Stelara injection every eight weeks. He experienced a severe gastrointestinal episode after taking Ozempic , leading to discontinuation of the medication.  He is still taking Glucotrol  5 mg daily and Farxiga  10 mg.  He has not consumed alcohol for nearly three years, which he believes has contributed to his good blood pressure and diabetes management. No current back pain while sitting but notes pain when transitioning from sitting to standing. He reports cramps in his back and legs, and numbness in his right leg and hip.       Objective:     BP 118/83 (BP Location: Left Arm, Patient Position: Sitting, Cuff Size: Normal)   Pulse (!) 58   Temp 97.9 F (36.6 C) (Oral)   Resp 18   Ht 5' 10.5 (1.791 m)   Wt 255 lb (115.7 kg)   SpO2 96%   BMI 36.07 kg/m    Physical Exam Vitals and nursing note reviewed.  Constitutional:      Appearance: Normal appearance.  HENT:     Head: Normocephalic and atraumatic.  Eyes:     Conjunctiva/sclera: Conjunctivae normal.  Cardiovascular:     Rate and Rhythm: Normal rate and regular rhythm.  Pulmonary:     Effort: Pulmonary effort is normal.     Breath sounds: Normal breath sounds.  Musculoskeletal:     Right lower leg: No edema.     Left lower leg: No edema.     Comments: Negative straight leg raise bilateral from seated position    Skin:    General: Skin is warm and dry.  Neurological:     Mental Status: He is alert and oriented to person, place, and time.  Psychiatric:        Mood and Affect: Mood normal.        Behavior: Behavior normal.        Thought Content: Thought content normal.        Judgment: Judgment normal.          Results for orders placed or performed in visit on 03/20/24   POCT glycosylated hemoglobin (Hb A1C)  Result Value Ref Range   Hemoglobin A1C 6.5 (A) 4.0 - 5.6 %   HbA1c POC (<> result, manual entry)     HbA1c, POC (prediabetic range)     HbA1c, POC (controlled diabetic range)        The ASCVD Risk score (Arnett DK, et al., 2019) failed to calculate for the following reasons:   Risk score cannot be calculated because patient has a medical history suggesting prior/existing ASCVD    Assessment & Plan:  Type 2 diabetes with complication [facility]) Assessment & Plan: He stopped taking Ozempic  because of the diarrhea. He is taking glipizide  5 mg daily and Farxiga  10 mg daily.  A1c today is 6.5%.  Orders: -     POCT glycosylated hemoglobin (Hb A1C)  Degeneration of intervertebral disc of lumbar region with discogenic back pain Assessment & Plan: Minimal symptoms today.  Has stopped going to the back doctor in the last 2 years.  Was previously treated with cyclobenzaprine  5 mg 3 times daily and Lyrica.  Will try gabapentin  300 mg 3 times daily and see how he does.  Follow back up in a month.  Orders: -     Gabapentin ; Take 1 capsule (300 mg total) by mouth 3 (three) times daily.  Dispense: 90 capsule; Refill: 3 -     Cyclobenzaprine  HCl; TAKE ONE TO TWO TABLETS BY MOUTH TWICE DAILY AS NEEDED FOR MUSCLE SPASMS  Dispense: 360 tablet; Refill: 0     Return in about 4 weeks (around 04/17/2024).    Brynlee Pennywell K Ahriana Gunkel, MD

## 2024-03-20 NOTE — Assessment & Plan Note (Signed)
 He stopped taking Ozempic  because of the diarrhea. He is taking glipizide  5 mg daily and Farxiga  10 mg daily.  A1c today is 6.5%.

## 2024-04-20 ENCOUNTER — Ambulatory Visit (INDEPENDENT_AMBULATORY_CARE_PROVIDER_SITE_OTHER): Admitting: Family Medicine

## 2024-04-20 ENCOUNTER — Other Ambulatory Visit: Payer: Self-pay

## 2024-04-20 ENCOUNTER — Encounter: Payer: Self-pay | Admitting: Family Medicine

## 2024-04-20 VITALS — BP 122/82 | HR 54 | Temp 97.6°F | Resp 18 | Ht 70.5 in | Wt 259.0 lb

## 2024-04-20 DIAGNOSIS — M51362 Other intervertebral disc degeneration, lumbar region with discogenic back pain and lower extremity pain: Secondary | ICD-10-CM | POA: Diagnosis not present

## 2024-04-20 DIAGNOSIS — G8929 Other chronic pain: Secondary | ICD-10-CM | POA: Diagnosis not present

## 2024-04-20 NOTE — Assessment & Plan Note (Signed)
 He notes numbness and pain radiating down his right leg about twice a month with the sensation that he will fall if he steps out on his right leg.  He has been on gabapentin  300 mg 3 times a day and cyclobenzaprine  5 to 10 mg 3 times a day.  He does not think the medication has helped at all.  Discussed doing physical therapy to see if that will help.  Likewise he needs to complete physical therapy before his insurance will let him have an MRI of his back.

## 2024-04-20 NOTE — Progress Notes (Signed)
 Established Patient Office Visit  Subjective   Patient ID: Jeffrey Solomon, male    DOB: 02-Jun-1961  Age: 63 y.o. MRN: [id number]  Chief Complaint  Patient presents with   Medical Management of Chronic Issues    HPI Delightful 63 year old with HTN, OSA (now on CPAP), ischemic CVA, DMT2 (11/19/23 A1c 9.7%, UMACR 51), obesity, UC, thyroid cancer (s/p thyroid lobectomy, papillary CA with follicular variant), left hemidiaphragm elevation, Hx EtOH misuse (in remission), s/p UUU plasty, Hx hypoxia/hypercapnia (09/15/2023 hospitalized for volume overload and hypoventilation 09/16/2023 TTE: HFpEF with EF greater than 55%, LV wall thickness undetermined, diastolic function undetermined).   Discussed the use of AI scribe software for clinical note transcription with the patient, who gave verbal consent to proceed.  History of Present Illness   Jeffrey Solomon is a 63 year old male who presents with persistent back pain and numbness in the right leg.  He has persistent back pain that has not changed over time. He is taking gabapentin  but is unsure of its effectiveness, as he does not notice any difference in his symptoms. He is confused about his medication, noting an instance where he took two pills without realizing they were the same. This was cyclobenzaprine  5mg .   He reports that he has a history of a slipped disc between the fourth and fifth lumbar vertebrae, which he believes contributes to his back pain. He has not engaged in physical therapy recently, although he recalls participating in therapy several years ago for an unspecified issue.  He experiences numbness and pain radiating down his right leg once or twice a month. He describes episodes where he feels like he might fall when stepping, although he has not fallen yet. He has not noticed significant improvement in the numbness and pain in his right leg.  He recalls a previous attempt to schedule an MRI, which was canceled due to  insurance issues. He has not rescheduled it yet. He mentions a past CT scan of his abdomen and pelvis ordered by a gastroenterologist, Dr. Lamar Ards, which involved drinking a liquid before the scan.  Socially, he has quit alcohol, which has positively impacted his blood pressure. He previously consumed 15 to 18 drinks nightly.      Objective:     BP 122/82 (BP Location: Right Arm, Patient Position: Sitting, Cuff Size: Normal)   Pulse (!) 54   Temp 97.6 F (36.4 C) (Oral)   Resp 18   Ht 5' 10.5 (1.791 m)   Wt 259 lb (117.5 kg)   SpO2 94%   BMI 36.64 kg/m    Physical Exam Vitals and nursing note reviewed.  Constitutional:      Appearance: Normal appearance.  HENT:     Head: Normocephalic and atraumatic.  Eyes:     Conjunctiva/sclera: Conjunctivae normal.  Cardiovascular:     Rate and Rhythm: Normal rate and regular rhythm.  Pulmonary:     Effort: Pulmonary effort is normal.     Breath sounds: Normal breath sounds.  Musculoskeletal:     Right lower leg: No edema.     Left lower leg: No edema.  Skin:    General: Skin is warm and dry.  Neurological:     Mental Status: He is alert and oriented to person, place, and time.  Psychiatric:        Mood and Affect: Mood normal.        Behavior: Behavior normal.        Thought Content: Thought  content normal.        Judgment: Judgment normal.          No results found for any visits on 04/20/24.    The ASCVD Risk score (Arnett DK, et al., 2019) failed to calculate for the following reasons:   Risk score cannot be calculated because patient has a medical history suggesting prior/existing ASCVD    Assessment & Plan:  Chronic right-sided low back pain with right-sided sciatica -     Ambulatory referral to Physical Therapy  Degeneration of intervertebral disc of lumbar region with discogenic back pain and lower extremity pain Assessment & Plan: He notes numbness and pain radiating down his right leg about twice a  month with the sensation that he will fall if he steps out on his right leg.  He has been on gabapentin  300 mg 3 times a day and cyclobenzaprine  5 to 10 mg 3 times a day.  He does not think the medication has helped at all.  Discussed doing physical therapy to see if that will help.  Likewise he needs to complete physical therapy before his insurance will let him have an MRI of his back.              Return in about 3 months (around 07/21/2024).    Natina Wiginton K Osker Ayoub, MD

## 2024-04-27 ENCOUNTER — Ambulatory Visit: Attending: Family Medicine

## 2024-04-27 DIAGNOSIS — M6281 Muscle weakness (generalized): Secondary | ICD-10-CM | POA: Insufficient documentation

## 2024-04-27 DIAGNOSIS — G8929 Other chronic pain: Secondary | ICD-10-CM | POA: Insufficient documentation

## 2024-04-27 DIAGNOSIS — R262 Difficulty in walking, not elsewhere classified: Secondary | ICD-10-CM | POA: Diagnosis present

## 2024-04-27 DIAGNOSIS — M5441 Lumbago with sciatica, right side: Secondary | ICD-10-CM | POA: Insufficient documentation

## 2024-04-27 NOTE — Therapy (Signed)
 OUTPATIENT PHYSICAL THERAPY THORACOLUMBAR EVALUATION   Patient Name: Jeffrey Solomon MRN: [id number] DOB:Aug 30, 1960, 63 y.o., male Today's Date: 04/28/2024  END OF SESSION:  PT End of Session - 04/27/24 1410     Visit Number 1    Number of Visits 24    Date for Recertification  07/20/24    Progress Note Due on Visit 10    PT Start Time 1402    PT Stop Time 1444    PT Time Calculation (min) 42 min    Equipment Utilized During Treatment Gait belt    Activity Tolerance Patient limited by pain    Behavior During Therapy WFL for tasks assessed/performed          Past Medical History:  Diagnosis Date   Diabetes mellitus without complication (HCC)    Hypertension    History reviewed. No pertinent surgical history. Patient Active Problem List   Diagnosis Date Noted   Foot callus 11/19/2023   Chronic heart failure with preserved ejection fraction (HFpEF) (HCC) 09/23/2023   History of ischemic stroke 09/16/2023   History of malignant neoplasm of thyroid 09/16/2023   Hypoxia 09/16/2023   Ulcerative colitis (HCC) 02/21/2022   Type 2 diabetes with complication (HCC) 06/22/2021   Thyroid cancer (HCC) 05/25/2021   Vitamin D  deficiency 09/29/2020   Hyperlipidemia 02/26/2020   Iron deficiency anemia 02/26/2020   Obesity 02/26/2020   Hyperparathyroidism 09/28/2019   OSA (obstructive sleep apnea) 09/16/2019   SAH (subarachnoid hemorrhage) (HCC) 12/30/2015   Degeneration of lumbar intervertebral disc 08/06/2011   Hypertension, benign 06/28/2010    PCP: Ziglar, Susan, MD  REFERRING PROVIDER: Ziglar, Susan, MD  REFERRING DIAG: [phone] (ICD-10-CM) - Chronic right-sided low back pain with right-sided sciatica   Rationale for Evaluation and Treatment: Rehabilitation  THERAPY DIAG:  Chronic right-sided low back pain with right-sided sciatica - Plan: PT plan of care cert/re-cert  Muscle weakness (generalized) - Plan: PT plan of care cert/re-cert  Difficulty in walking,  not elsewhere classified - Plan: PT plan of care cert/re-cert  ONSET DATE: Since age of 42 per patient report  SUBJECTIVE:                                                                                                                                                                                           SUBJECTIVE STATEMENT: Patient reports having chronic low back pain for many years yet progressive worse lately. States intermittent R LE sciatica- not consistent with radicular symptoms but does report constant toothache pain in right side low back. States unsure if anything will help and meds are not helping. Hasn't really tried heat but not  a fan of ice. Reports difficulty with everyday life but still able to do all ADL's but with pain. States does not work and goal is to get some relief.   PERTINENT HISTORY:  History taken from medical chart- office visit from Dr. Ziglar on 10/27 History of Present Illness   Jeffrey Solomon is a 63 year old male who presents with persistent back pain and numbness in the right leg.   He has persistent back pain that has not changed over time. He is taking gabapentin  but is unsure of its effectiveness, as he does not notice any difference in his symptoms. He is confused about his medication, noting an instance where he took two pills without realizing they were the same. This was cyclobenzaprine  5mg .    He reports that he has a history of a slipped disc between the fourth and fifth lumbar vertebrae, which he believes contributes to his back pain. He has not engaged in physical therapy recently, although he recalls participating in therapy several years ago for an unspecified issue.   He experiences numbness and pain radiating down his right leg once or twice a month. He describes episodes where he feels like he might fall when stepping, although he has not fallen yet. He has not noticed significant improvement in the numbness and pain in his right leg.    He recalls a previous attempt to schedule an MRI, which was canceled due to insurance issues. He has not rescheduled it yet. He mentions a past CT scan of his abdomen and pelvis ordered by a gastroenterologist, Dr. Lamar Ards, which involved drinking a liquid before the scan.  PAIN:  Are you having pain? Yes: NPRS scale: current 7/10; best= 4/10; worst = 9/10 Pain location: Right side low back  Pain description: Sharp, toothache  Aggravating factors: prolonged walking Relieving factors: nothing  PRECAUTIONS: None  RED FLAGS: None   WEIGHT BEARING RESTRICTIONS: No  FALLS:  Has patient fallen in last 6 months? No  LIVING ENVIRONMENT: Lives with: lives with an adult companion Lives in: House/apartment Stairs: Yes: External: 2.5 steps; on left going up Has following equipment at home: [name] - 2 wheeled  OCCUPATION: Disabled   PLOF: Mother assist with meals. Still driving  PATIENT GOALS: Decrease swelling   NEXT MD VISIT: Jul 21, 2024 with Dr. Onita  OBJECTIVE:  Note: Objective measures were completed at Evaluation unless otherwise noted.  DIAGNOSTIC FINDINGS:  EXAM: Magnetic resonance imaging, spinal canal and contents, lumbar, without contrast material. DATE: 01/03/2022 12:39 PM ACCESSION: [id number] UN DICTATED: 01/03/2022 12:53 PM INTERPRETATION LOCATION: 54.  CLINICAL INDICATION: 63 years old Male with right lumbar radiculopathy x 6 months ; Lumbar radiculopathy, symptoms persist with > 6 wks treatment  - M54.16 - Right lumbar radiculopathy - M54.9 - Back pain, unspecified back location, unspecified back pain laterality, unspecified chronicity    COMPARISON: None  TECHNIQUE: Multiplanar MRI was performed through the lumbar spine without intravenous contrast.  FINDINGS: Bone marrow signal intensity is normal. The visualized cord is unremarkable and the conus medullaris ends at a normal level.  The T12-L1 disc space is within normal limits.  The L1-L2 disc  space is within normal limits.  The L2-L3 disc space demonstrates minimal desiccation of the disc as well as a diffuse bulging disc. There is degeneration of facet joints resulting in a borderline spinal canal diameter. The neuroforamina are widely patent.  These L3-L4 disc space shows desiccation of the disc and a diffuse disc bulge. The facet  joints are degenerated. There is no evidence of spinal canal diameter. There is moderate narrowing of the neuroforamina bilaterally and symmetrically.  The L4-L5 disc space demonstrates desiccation of the disc. There is a small posterior annular fissure. The disc is diffusely bulging is no evidence of spinal canal narrowing. The facet joints are degenerated bilaterally. The neural foramina are moderately to severely stenotic bilaterally and symmetrically.  The L5-S1 disc space demonstrates desiccation of the disc as well as a diffuse disc bulge which is asymmetrical and predominantly to the left. This results in moderate narrowing of the left neuroforamen. The right-sided neuroforamen is normal. The diameter of the spinal canal is normal to. The paraspinal tissues are within normal limits.  For the purposes of this dictation, the lowest well formed intervertebral disc space is assumed to be the L5-S1 level, and there are presumed to be five lumbar-type vertebral bodies. Procedure Note  Thalia Males, MD - 01/03/2022 Formatting of this note might be different from the original. EXAM: Magnetic resonance imaging, spinal canal and contents, lumbar, without contrast material. DATE: 01/03/2022 12:39 PM ACCESSION: [id number] UN DICTATED: 01/03/2022 12:53 PM INTERPRETATION LOCATION: 54.  CLINICAL INDICATION: 63 years old Male with right lumbar radiculopathy x 6 months ; Lumbar radiculopathy, symptoms persist with > 6 wks treatment  - M54.16 - Right lumbar radiculopathy - M54.9 - Back pain, unspecified back location, unspecified back pain laterality,  unspecified chronicity    COMPARISON: None  TECHNIQUE: Multiplanar MRI was performed through the lumbar spine without intravenous contrast.  FINDINGS: Bone marrow signal intensity is normal. The visualized cord is unremarkable and the conus medullaris ends at a normal level.  The T12-L1 disc space is within normal limits.  The L1-L2 disc space is within normal limits.  The L2-L3 disc space demonstrates minimal desiccation of the disc as well as a diffuse bulging disc. There is degeneration of facet joints resulting in a borderline spinal canal diameter. The neuroforamina are widely patent.  These L3-L4 disc space shows desiccation of the disc and a diffuse disc bulge. The facet joints are degenerated. There is no evidence of spinal canal diameter. There is moderate narrowing of the neuroforamina bilaterally and symmetrically.  The L4-L5 disc space demonstrates desiccation of the disc. There is a small posterior annular fissure. The disc is diffusely bulging is no evidence of spinal canal narrowing. The facet joints are degenerated bilaterally. The neural foramina are moderately to severely stenotic bilaterally and symmetrically.  The L5-S1 disc space demonstrates desiccation of the disc as well as a diffuse disc bulge which is asymmetrical and predominantly to the left. This results in moderate narrowing of the left neuroforamen. The right-sided neuroforamen is normal. The diameter of the spinal canal is normal to. The paraspinal tissues are within normal limits.  For the purposes of this dictation, the lowest well formed intervertebral disc space is assumed to be the L5-S1 level, and there are presumed to be five lumbar-type vertebral bodies.  IMPRESSION: Mild degenerative changes no evidence of significant spinal canal stenosis. Exam End: 01/03/22 12:39   Specimen Collected: 01/03/22 12:53 Last Resulted: 01/03/22 12:57  Received From: [facility]  Result Received: 08/12/23 13:11     PATIENT SURVEYS:  Modified Oswestry:  MODIFIED OSWESTRY DISABILITY SCALE  Date: 04/27/2024 Score  Pain intensity 4 =  Pain medication provides me with little relief from pain.  2. Personal care (washing, dressing, etc.) 2 =  It is painful to take care of myself, and I am slow and careful.  3. Lifting 3 = Pain prevents me from lifting heavy weights, but I can manage light to medium weights if they are conveniently positioned  4. Walking 3 =  Pain prevents me from walking more than  mile.  5. Sitting 2 =  Pain prevents me from sitting more than 1 hour.  6. Standing 3 =  Pain prevents me from standing more than 1/2 hour.  7. Sleeping 0 = Pain does not prevent me from sleeping well.  8. Social Life 3 =  Pain prevents me from going out very often.  9. Traveling 3 = My pain restricts my travel over 1 hour  10. Employment/ Homemaking 2 = I can perform most of my homemaking/job duties, but pain prevents me from performing more physically stressful activities (eg, lifting, vacuuming).  Total 25/50 or 50%   Interpretation of scores: Score Category Description  0-20% Minimal Disability The patient can cope with most living activities. Usually no treatment is indicated apart from advice on lifting, sitting and exercise  21-40% Moderate Disability The patient experiences more pain and difficulty with sitting, lifting and standing. Travel and social life are more difficult and they may be disabled from work. Personal care, sexual activity and sleeping are not grossly affected, and the patient can usually be managed by conservative means  41-60% Severe Disability Pain remains the main problem in this group, but activities of daily living are affected. These patients require a detailed investigation  61-80% Crippled Back pain impinges on all aspects of the patient's life. Positive intervention is required  81-100% Bed-bound These patients are either bed-bound or exaggerating their symptoms  Bluford FORBES Zoe DELENA Karon DELENA, et al. Surgery versus conservative management of stable thoracolumbar fracture: the PRESTO feasibility RCT. [place] ([place]): [employer]; 2021 Nov. [facility] Technology Assessment, No. 25.62.) Appendix 3, Oswestry Disability Index category descriptors. Available from: Findjewelers.cz  Minimally Clinically Important Difference (MCID) = 12.8%  COGNITION: Overall cognitive status: Within functional limits for tasks assessed     SENSATION: Light touch: Impaired   MUSCLE LENGTH: Hamstrings: Right 70 deg; Left 82 deg   POSTURE: increased lumbar lordosis  PALPATION: (+) edema R low back just superior to SI region- (+) for pain  LUMBAR ROM:   AROM eval  Flexion *Fingertips to ankle  Extension *WFL  Right lateral flexion *Fingertips to lateral fibular head  Left lateral flexion *Fingertips to lateral knee joint  Right rotation *Full yet painful  Left rotation *Full yet painful   (Blank rows = not tested) (*= painful ROM)  LOWER EXTREMITY ROM:     Active  Right eval Left eval  Hip flexion    Hip extension    Hip abduction    Hip adduction    Hip internal rotation    Hip external rotation    Knee flexion    Knee extension    Ankle dorsiflexion    Ankle plantarflexion    Ankle inversion    Ankle eversion     (Blank rows = not tested)  LOWER EXTREMITY MMT:    MMT Right eval Left eval  Hip flexion 3+ 4  Hip extension    Hip abduction 3+ 4  Hip adduction 3+ 4  Hip internal rotation 3+ 4  Hip external rotation 3+ 4  Knee flexion 4 4  Knee extension 4 4  Ankle dorsiflexion 4+ 5  Ankle plantarflexion    Ankle inversion    Ankle eversion     (Blank rows = not  tested)  LUMBAR SPECIAL TESTS:  Straight leg raise test: Negative, Slump test: Positive, and SI Compression/distraction test: Negative  FUNCTIONAL TESTS:  5 times sit to stand: 24.3 sec with min UE support  GAIT: Distance walked: 50  feet Assistive device utilized: None Level of assistance: Complete Independence Comments: Antalgic with decreased step length and poor heel to toe  TREATMENT DATE: 04/27/2024 PT evaluation       Self care/Home management:  Discussed anatomy terms associated with low back  Discussed low back management/pain relieving measures- meds, heat, ice, DN, stretching/ROM activities, resting                                                                                                                             PATIENT EDUCATION:  Education details: Purpose of PT and proposed plan of care; and See above self care section Person educated: Patient Education method: Explanation Education comprehension: verbalized understanding  HOME EXERCISE PROGRAM: To be initiated next visit  ASSESSMENT:  CLINICAL IMPRESSION: Patient is a 63 y.o. male who was seen today for physical therapy evaluation and treatment for Chronic low back pain with Right sided sciatica. Patient presents with very pain limited lumbar ROM and decreased functional mobility along with some BLE muscle weakness. He presents with limited knowledge of strategies to improve his condition and Patient will benefit from skilled PT services to address his pain, immobility and return to pain-free function at home and work.   OBJECTIVE IMPAIRMENTS: Abnormal gait, decreased activity tolerance, decreased balance, decreased coordination, decreased endurance, decreased knowledge of use of DME, decreased mobility, difficulty walking, decreased ROM, decreased strength, decreased safety awareness, impaired perceived functional ability, impaired flexibility, impaired sensation, postural dysfunction, obesity, and pain.   ACTIVITY LIMITATIONS: carrying, lifting, bending, sitting, standing, squatting, stairs, transfers, bed mobility, and dressing  PARTICIPATION LIMITATIONS: meal prep, cleaning, laundry, shopping, community activity, and yard  work  PERSONAL FACTORS: 3+ comorbidities: DM, Neuropathy, Crohn's, HTN, SAH are also affecting patient's functional outcome.   REHAB POTENTIAL: Good  CLINICAL DECISION MAKING: Evolving/moderate complexity  EVALUATION COMPLEXITY: Moderate   GOALS: Goals reviewed with patient? Yes  SHORT TERM GOALS: Target date: 06/04/2024  Pt will be independent with HEP in order to improve strength and decrease back pain in order to improve pain-free function at home and work. Baseline: 04/27/2024= no formal HEP in place Goal status: INITIAL     LONG TERM GOALS: Target date: 07/20/2024  Pt will decrease mODI score by at least 13 points in order demonstrate clinically significant reduction in back pain/disability.  Baseline: 04/27/2024 Goal status: INITIAL  2.  Pt will decrease worst back pain as reported on NPRS by at least 2 points in order to demonstrate clinically significant reduction in back pain.  Baseline:  Goal status: INITIAL  3.  Pt will increase strength of by at least 1/2 MMT grade in order to demonstrate improvement in strength and function. Baseline: 04/27/2024= See above chart Goal status: INITIAL  4.  Patient will report ability to walk > 1/4 mile without reset and < 7/10 low back pain for  improved community distances.  Baseline: 04/27/2024= Pain prevents from walking 1/4 mi Goal status: INITIAL  PLAN:  PT FREQUENCY: 1-2x/week  PT DURATION: 12 weeks  PLANNED INTERVENTIONS: 97164- PT Re-evaluation, 97750- Physical Performance Testing, 97110-Therapeutic exercises, 97530- Therapeutic activity, [id number]- Neuromuscular re-education, 97535- Self Care, [id number]- Manual therapy, [id number]- Gait training, [phone]- Orthotic Initial, [phone]- Orthotic/Prosthetic subsequent, [phone]- Canalith repositioning, [phone]- Electrical stimulation (manual), [id number]- Ultrasound, 79439 (1-2 muscles), [id number] (3+ muscles)- Dry Needling, Patient/Family education, Balance training, Stair training, Taping, Joint  mobilization, Joint manipulation, Spinal manipulation, Spinal mobilization, Vestibular training, DME instructions, Cryotherapy, and Moist heat.  PLAN FOR NEXT SESSION:  Manual therapy for low back pain/ROM Instruct in HEP  Core stabilization activities    Reyes LOISE London, PT 04/28/2024, 8:51 AM

## 2024-04-30 ENCOUNTER — Ambulatory Visit

## 2024-04-30 DIAGNOSIS — R262 Difficulty in walking, not elsewhere classified: Secondary | ICD-10-CM

## 2024-04-30 DIAGNOSIS — M6281 Muscle weakness (generalized): Secondary | ICD-10-CM

## 2024-04-30 DIAGNOSIS — G8929 Other chronic pain: Secondary | ICD-10-CM

## 2024-04-30 DIAGNOSIS — M5441 Lumbago with sciatica, right side: Secondary | ICD-10-CM | POA: Diagnosis not present

## 2024-04-30 NOTE — Therapy (Signed)
 OUTPATIENT PHYSICAL THERAPY THORACOLUMBAR TREATMENT   Patient Name: Jeffrey Solomon MRN: [id number] DOB:[birth year], 63 y.o., male Today's Date: 04/30/2024  END OF SESSION:  PT End of Session - 04/30/24 0939     Visit Number 2    Number of Visits 24    Date for Recertification  07/20/24    Progress Note Due on Visit 10    PT Start Time 0936    PT Stop Time 1016    PT Time Calculation (min) 40 min    Equipment Utilized During Treatment Gait belt    Activity Tolerance Patient limited by pain    Behavior During Therapy WFL for tasks assessed/performed          Past Medical History:  Diagnosis Date   Diabetes mellitus without complication (HCC)    Hypertension    History reviewed. No pertinent surgical history. Patient Active Problem List   Diagnosis Date Noted   Foot callus 11/19/2023   Chronic heart failure with preserved ejection fraction (HFpEF) (HCC) 09/23/2023   History of ischemic stroke 09/16/2023   History of malignant neoplasm of thyroid 09/16/2023   Hypoxia 09/16/2023   Ulcerative colitis (HCC) 02/21/2022   Type 2 diabetes with complication (HCC) 06/22/2021   Thyroid cancer (HCC) 05/25/2021   Vitamin D  deficiency 09/29/2020   Hyperlipidemia 02/26/2020   Iron deficiency anemia 02/26/2020   Obesity 02/26/2020   Hyperparathyroidism 09/28/2019   OSA (obstructive sleep apnea) 09/16/2019   SAH (subarachnoid hemorrhage) (HCC) 12/30/2015   Degeneration of lumbar intervertebral disc 08/06/2011   Hypertension, benign 06/28/2010    PCP: Ziglar, Susan, MD  REFERRING PROVIDER: Ziglar, Susan, MD  REFERRING DIAG: [phone] (ICD-10-CM) - Chronic right-sided low back pain with right-sided sciatica   Rationale for Evaluation and Treatment: Rehabilitation  THERAPY DIAG:  Chronic right-sided low back pain with right-sided sciatica  Muscle weakness (generalized)  Difficulty in walking, not elsewhere classified  ONSET DATE: Since age of 53 per patient  report  SUBJECTIVE:                                                                                                                                                                                           SUBJECTIVE STATEMENT: From Today: Patient reports approx 6/10 Right sided Low back pain.   Patient reports having chronic low back pain for many years yet progressive worse lately. States intermittent R LE sciatica- not consistent with radicular symptoms but does report constant toothache pain in right side low back. States unsure if anything will help and meds are not helping. Hasn't really tried heat but not a fan of ice. Reports difficulty with everyday  life but still able to do all ADL's but with pain. States does not work and goal is to get some relief.   PERTINENT HISTORY:  History taken from medical chart- office visit from Dr. Ziglar on 10/27 History of Present Illness   BAUER AUSBORN is a 63 year old male who presents with persistent back pain and numbness in the right leg.   He has persistent back pain that has not changed over time. He is taking gabapentin  but is unsure of its effectiveness, as he does not notice any difference in his symptoms. He is confused about his medication, noting an instance where he took two pills without realizing they were the same. This was cyclobenzaprine  5mg .    He reports that he has a history of a slipped disc between the fourth and fifth lumbar vertebrae, which he believes contributes to his back pain. He has not engaged in physical therapy recently, although he recalls participating in therapy several years ago for an unspecified issue.   He experiences numbness and pain radiating down his right leg once or twice a month. He describes episodes where he feels like he might fall when stepping, although he has not fallen yet. He has not noticed significant improvement in the numbness and pain in his right leg.   He recalls a previous attempt to  schedule an MRI, which was canceled due to insurance issues. He has not rescheduled it yet. He mentions a past CT scan of his abdomen and pelvis ordered by a gastroenterologist, Dr. Lamar Ards, which involved drinking a liquid before the scan.  PAIN:  Are you having pain? Yes: NPRS scale: current 7/10; best= 4/10; worst = 9/10 Pain location: Right side low back  Pain description: Sharp, toothache  Aggravating factors: prolonged walking Relieving factors: nothing  PRECAUTIONS: None  RED FLAGS: None   WEIGHT BEARING RESTRICTIONS: No  FALLS:  Has patient fallen in last 6 months? No  LIVING ENVIRONMENT: Lives with: lives with an adult companion Lives in: House/apartment Stairs: Yes: External: 2.5 steps; on left going up Has following equipment at home: [name] - 2 wheeled  OCCUPATION: Disabled   PLOF: Mother assist with meals. Still driving  PATIENT GOALS: Decrease swelling   NEXT MD VISIT: Jul 21, 2024 with Dr. Onita  OBJECTIVE:  Note: Objective measures were completed at Evaluation unless otherwise noted.  DIAGNOSTIC FINDINGS:  EXAM: Magnetic resonance imaging, spinal canal and contents, lumbar, without contrast material. DATE: 01/03/2022 12:39 PM ACCESSION: [id number] UN DICTATED: 01/03/2022 12:53 PM INTERPRETATION LOCATION: 54.  CLINICAL INDICATION: 63 years old Male with right lumbar radiculopathy x 6 months ; Lumbar radiculopathy, symptoms persist with > 6 wks treatment  - M54.16 - Right lumbar radiculopathy - M54.9 - Back pain, unspecified back location, unspecified back pain laterality, unspecified chronicity    COMPARISON: None  TECHNIQUE: Multiplanar MRI was performed through the lumbar spine without intravenous contrast.  FINDINGS: Bone marrow signal intensity is normal. The visualized cord is unremarkable and the conus medullaris ends at a normal level.  The T12-L1 disc space is within normal limits.  The L1-L2 disc space is within normal limits.  The  L2-L3 disc space demonstrates minimal desiccation of the disc as well as a diffuse bulging disc. There is degeneration of facet joints resulting in a borderline spinal canal diameter. The neuroforamina are widely patent.  These L3-L4 disc space shows desiccation of the disc and a diffuse disc bulge. The facet joints are degenerated. There is no evidence of  spinal canal diameter. There is moderate narrowing of the neuroforamina bilaterally and symmetrically.  The L4-L5 disc space demonstrates desiccation of the disc. There is a small posterior annular fissure. The disc is diffusely bulging is no evidence of spinal canal narrowing. The facet joints are degenerated bilaterally. The neural foramina are moderately to severely stenotic bilaterally and symmetrically.  The L5-S1 disc space demonstrates desiccation of the disc as well as a diffuse disc bulge which is asymmetrical and predominantly to the left. This results in moderate narrowing of the left neuroforamen. The right-sided neuroforamen is normal. The diameter of the spinal canal is normal to. The paraspinal tissues are within normal limits.  For the purposes of this dictation, the lowest well formed intervertebral disc space is assumed to be the L5-S1 level, and there are presumed to be five lumbar-type vertebral bodies. Procedure Note  Thalia Males, MD - 01/03/2022 Formatting of this note might be different from the original. EXAM: Magnetic resonance imaging, spinal canal and contents, lumbar, without contrast material. DATE: 01/03/2022 12:39 PM ACCESSION: [id number] UN DICTATED: 01/03/2022 12:53 PM INTERPRETATION LOCATION: 54.  CLINICAL INDICATION: 63 years old Male with right lumbar radiculopathy x 6 months ; Lumbar radiculopathy, symptoms persist with > 6 wks treatment  - M54.16 - Right lumbar radiculopathy - M54.9 - Back pain, unspecified back location, unspecified back pain laterality, unspecified chronicity    COMPARISON:  None  TECHNIQUE: Multiplanar MRI was performed through the lumbar spine without intravenous contrast.  FINDINGS: Bone marrow signal intensity is normal. The visualized cord is unremarkable and the conus medullaris ends at a normal level.  The T12-L1 disc space is within normal limits.  The L1-L2 disc space is within normal limits.  The L2-L3 disc space demonstrates minimal desiccation of the disc as well as a diffuse bulging disc. There is degeneration of facet joints resulting in a borderline spinal canal diameter. The neuroforamina are widely patent.  These L3-L4 disc space shows desiccation of the disc and a diffuse disc bulge. The facet joints are degenerated. There is no evidence of spinal canal diameter. There is moderate narrowing of the neuroforamina bilaterally and symmetrically.  The L4-L5 disc space demonstrates desiccation of the disc. There is a small posterior annular fissure. The disc is diffusely bulging is no evidence of spinal canal narrowing. The facet joints are degenerated bilaterally. The neural foramina are moderately to severely stenotic bilaterally and symmetrically.  The L5-S1 disc space demonstrates desiccation of the disc as well as a diffuse disc bulge which is asymmetrical and predominantly to the left. This results in moderate narrowing of the left neuroforamen. The right-sided neuroforamen is normal. The diameter of the spinal canal is normal to. The paraspinal tissues are within normal limits.  For the purposes of this dictation, the lowest well formed intervertebral disc space is assumed to be the L5-S1 level, and there are presumed to be five lumbar-type vertebral bodies.  IMPRESSION: Mild degenerative changes no evidence of significant spinal canal stenosis. Exam End: 01/03/22 12:39   Specimen Collected: 01/03/22 12:53 Last Resulted: 01/03/22 12:57  Received From: [facility]  Result Received: 08/12/23 13:11    PATIENT SURVEYS:  Modified Oswestry:   MODIFIED OSWESTRY DISABILITY SCALE  Date: 04/27/2024 Score  Pain intensity 4 =  Pain medication provides me with little relief from pain.  2. Personal care (washing, dressing, etc.) 2 =  It is painful to take care of myself, and I am slow and careful.  3. Lifting 3 = Pain prevents me  from lifting heavy weights, but I can manage light to medium weights if they are conveniently positioned  4. Walking 3 =  Pain prevents me from walking more than  mile.  5. Sitting 2 =  Pain prevents me from sitting more than 1 hour.  6. Standing 3 =  Pain prevents me from standing more than 1/2 hour.  7. Sleeping 0 = Pain does not prevent me from sleeping well.  8. Social Life 3 =  Pain prevents me from going out very often.  9. Traveling 3 = My pain restricts my travel over 1 hour  10. Employment/ Homemaking 2 = I can perform most of my homemaking/job duties, but pain prevents me from performing more physically stressful activities (eg, lifting, vacuuming).  Total 25/50 or 50%   Interpretation of scores: Score Category Description  0-20% Minimal Disability The patient can cope with most living activities. Usually no treatment is indicated apart from advice on lifting, sitting and exercise  21-40% Moderate Disability The patient experiences more pain and difficulty with sitting, lifting and standing. Travel and social life are more difficult and they may be disabled from work. Personal care, sexual activity and sleeping are not grossly affected, and the patient can usually be managed by conservative means  41-60% Severe Disability Pain remains the main problem in this group, but activities of daily living are affected. These patients require a detailed investigation  61-80% Crippled Back pain impinges on all aspects of the patient's life. Positive intervention is required  81-100% Bed-bound These patients are either bed-bound or exaggerating their symptoms  Bluford FORBES Zoe DELENA Karon DELENA, et al. Surgery versus  conservative management of stable thoracolumbar fracture: the PRESTO feasibility RCT. [place] ([place]): [employer]; 2021 Nov. [facility] Technology Assessment, No. 25.62.) Appendix 3, Oswestry Disability Index category descriptors. Available from: Findjewelers.cz  Minimally Clinically Important Difference (MCID) = 12.8%  COGNITION: Overall cognitive status: Within functional limits for tasks assessed     SENSATION: Light touch: Impaired   MUSCLE LENGTH: Hamstrings: Right 70 deg; Left 82 deg   POSTURE: increased lumbar lordosis  PALPATION: (+) edema R low back just superior to SI region- (+) for pain  LUMBAR ROM:   AROM eval  Flexion *Fingertips to ankle  Extension *WFL  Right lateral flexion *Fingertips to lateral fibular head  Left lateral flexion *Fingertips to lateral knee joint  Right rotation *Full yet painful  Left rotation *Full yet painful   (Blank rows = not tested) (*= painful ROM)  LOWER EXTREMITY ROM:     Active  Right eval Left eval  Hip flexion    Hip extension    Hip abduction    Hip adduction    Hip internal rotation    Hip external rotation    Knee flexion    Knee extension    Ankle dorsiflexion    Ankle plantarflexion    Ankle inversion    Ankle eversion     (Blank rows = not tested)  LOWER EXTREMITY MMT:    MMT Right eval Left eval  Hip flexion 3+ 4  Hip extension    Hip abduction 3+ 4  Hip adduction 3+ 4  Hip internal rotation 3+ 4  Hip external rotation 3+ 4  Knee flexion 4 4  Knee extension 4 4  Ankle dorsiflexion 4+ 5  Ankle plantarflexion    Ankle inversion    Ankle eversion     (Blank rows = not tested)  LUMBAR SPECIAL TESTS:  Straight  leg raise test: Negative, Slump test: Positive, and SI Compression/distraction test: Negative  FUNCTIONAL TESTS:  5 times sit to stand: 24.3 sec with min UE support  GAIT: Distance walked: 50 feet Assistive device utilized: None Level of  assistance: Complete Independence Comments: Antalgic with decreased step length and poor heel to toe  TREATMENT DATE: 04/27/2024  *Moist heat to low back while performing low back stretching today.     Therex:  Single knee to chest- hold 30 sec x 3 each side Lower trunk rotation- Hold 30 sec x 5 each Side Supine piriformis stretch- hold 30 sec x 3  each Side Supine figure 4 stretch - Hold 30 sec x 3 each Side Supine bridge 2 x 10 reps   Seated lumbar flex stretch with Large Red Tball- hold 30 sec x 4 Seated figure 4- hold 30 sec x 3 each Side Seated piriformis -hold 30 sec x 3 each Side    Self care/Home management:  Reviewed modalities including heat, ice, topical creams, and discussed and issued handout for below HEP.      Instructed in log roll technique for home as well with demonstration and patient able to follow cues well and reported decreased pain                                                                                                                      PATIENT EDUCATION:  Education details: Purpose of PT and proposed plan of care; and See above self care section Person educated: Patient Education method: Explanation Education comprehension: verbalized understanding  HOME EXERCISE PROGRAM: Access Code: [email or web address] URL: [email or web address] Date: 04/30/2024 Prepared by: Reyes London  Exercises - Supine Single Knee to Chest Stretch  - 1 x daily - 3 sets - 30 hold - Supine Lower Trunk Rotation  - 1 x daily - 3 sets - 30 sec hold - Supine Piriformis Stretch with Foot on Ground  - 1 x daily - 3 sets - 30 sec hold - Supine Bridge  - 1 x daily - 3 sets - 10 reps - Seated Lumbar Flexion Stretch  - 1 x daily - 3 sets - 30 hold - Seated Figure 4 Piriformis Stretch  - 1 x daily - 3 sets - 30 hold - Seated Piriformis Stretch  - 1 x daily - 3 sets - 30 hold - Supine Figure 4 Piriformis Stretch  - 1 x daily - 3 sets - 30  hold   ASSESSMENT:  CLINICAL IMPRESSION: Patient is a 63 y.o. male who was seen today for physical therapy treatment for Chronic low back pain with Right sided sciatica. Patient responded well to heat with all activities today. He reported decreased overall pain from 6/10 beginning of session down to 4/10. He was instructed in some beginner low back mobility activities - to be performed as pain-free as possible. Issued handout for compliance with HEP and patient will benefit from review and progression in next visit.  Patient will benefit from skilled PT  services to address his pain, immobility and return to pain-free function at home and work.   OBJECTIVE IMPAIRMENTS: Abnormal gait, decreased activity tolerance, decreased balance, decreased coordination, decreased endurance, decreased knowledge of use of DME, decreased mobility, difficulty walking, decreased ROM, decreased strength, decreased safety awareness, impaired perceived functional ability, impaired flexibility, impaired sensation, postural dysfunction, obesity, and pain.   ACTIVITY LIMITATIONS: carrying, lifting, bending, sitting, standing, squatting, stairs, transfers, bed mobility, and dressing  PARTICIPATION LIMITATIONS: meal prep, cleaning, laundry, shopping, community activity, and yard work  PERSONAL FACTORS: 3+ comorbidities: DM, Neuropathy, Crohn's, HTN, SAH are also affecting patient's functional outcome.   REHAB POTENTIAL: Good  CLINICAL DECISION MAKING: Evolving/moderate complexity  EVALUATION COMPLEXITY: Moderate   GOALS: Goals reviewed with patient? Yes  SHORT TERM GOALS: Target date: 06/04/2024  Pt will be independent with HEP in order to improve strength and decrease back pain in order to improve pain-free function at home and work. Baseline: 04/27/2024= no formal HEP in place Goal status: INITIAL     LONG TERM GOALS: Target date: 07/20/2024  Pt will decrease mODI score by at least 13 points in order  demonstrate clinically significant reduction in back pain/disability.  Baseline: 04/27/2024 Goal status: INITIAL  2.  Pt will decrease worst back pain as reported on NPRS by at least 2 points in order to demonstrate clinically significant reduction in back pain.  Baseline:  Goal status: INITIAL  3.  Pt will increase strength of by at least 1/2 MMT grade in order to demonstrate improvement in strength and function. Baseline: 04/27/2024= See above chart Goal status: INITIAL  4.  Patient will report ability to walk > 1/4 mile without reset and < 7/10 low back pain for  improved community distances.  Baseline: 04/27/2024= Pain prevents from walking 1/4 mi Goal status: INITIAL  PLAN:  PT FREQUENCY: 1-2x/week  PT DURATION: 12 weeks  PLANNED INTERVENTIONS: 97164- PT Re-evaluation, 97750- Physical Performance Testing, 97110-Therapeutic exercises, 97530- Therapeutic activity, [id number]- Neuromuscular re-education, 97535- Self Care, [id number]- Manual therapy, [id number]- Gait training, [phone]- Orthotic Initial, [phone]- Orthotic/Prosthetic subsequent, [phone]- Canalith repositioning, [phone]- Electrical stimulation (manual), [id number]- Ultrasound, 79439 (1-2 muscles), [id number] (3+ muscles)- Dry Needling, Patient/Family education, Balance training, Stair training, Taping, Joint mobilization, Joint manipulation, Spinal manipulation, Spinal mobilization, Vestibular training, DME instructions, Cryotherapy, and Moist heat.  PLAN FOR NEXT SESSION:  Manual therapy for low back pain/ROM Review and progress in HEP  Core stabilization activities    Reyes LOISE London, PT 04/30/2024, 10:40 AM

## 2024-05-04 ENCOUNTER — Ambulatory Visit

## 2024-05-04 DIAGNOSIS — M6281 Muscle weakness (generalized): Secondary | ICD-10-CM

## 2024-05-04 DIAGNOSIS — M5441 Lumbago with sciatica, right side: Secondary | ICD-10-CM | POA: Diagnosis not present

## 2024-05-04 DIAGNOSIS — G8929 Other chronic pain: Secondary | ICD-10-CM

## 2024-05-04 DIAGNOSIS — R262 Difficulty in walking, not elsewhere classified: Secondary | ICD-10-CM

## 2024-05-04 NOTE — Therapy (Signed)
 OUTPATIENT PHYSICAL THERAPY THORACOLUMBAR TREATMENT   Patient Name: Jeffrey Solomon MRN: [id number] DOB:08-08-1960, 63 y.o., male Today's Date: 05/04/2024  END OF SESSION:  PT End of Session - 05/04/24 1412     Visit Number 3    Number of Visits 24    Date for Recertification  07/20/24    Progress Note Due on Visit 10    PT Start Time 1403    PT Stop Time 1443    PT Time Calculation (min) 40 min    Equipment Utilized During Treatment Gait belt    Activity Tolerance Patient limited by pain    Behavior During Therapy WFL for tasks assessed/performed           Past Medical History:  Diagnosis Date   Diabetes mellitus without complication (HCC)    Hypertension    History reviewed. No pertinent surgical history. Patient Active Problem List   Diagnosis Date Noted   Foot callus 11/19/2023   Chronic heart failure with preserved ejection fraction (HFpEF) (HCC) 09/23/2023   History of ischemic stroke 09/16/2023   History of malignant neoplasm of thyroid 09/16/2023   Hypoxia 09/16/2023   Ulcerative colitis (HCC) 02/21/2022   Type 2 diabetes with complication (HCC) 06/22/2021   Thyroid cancer (HCC) 05/25/2021   Vitamin D  deficiency 09/29/2020   Hyperlipidemia 02/26/2020   Iron deficiency anemia 02/26/2020   Obesity 02/26/2020   Hyperparathyroidism 09/28/2019   OSA (obstructive sleep apnea) 09/16/2019   SAH (subarachnoid hemorrhage) (HCC) 12/30/2015   Degeneration of lumbar intervertebral disc 08/06/2011   Hypertension, benign 06/28/2010    PCP: Ziglar, Susan, MD  REFERRING PROVIDER: Ziglar, Susan, MD  REFERRING DIAG: [phone] (ICD-10-CM) - Chronic right-sided low back pain with right-sided sciatica   Rationale for Evaluation and Treatment: Rehabilitation  THERAPY DIAG:  Chronic right-sided low back pain with right-sided sciatica  Muscle weakness (generalized)  Difficulty in walking, not elsewhere classified  ONSET DATE: Since age of 38 per patient  report  SUBJECTIVE:                                                                                                                                                                                           SUBJECTIVE STATEMENT: From Today: Patient reports moving around a little better today- Some days my back doesn't bother me but most days it does- Rating at a 5/10 today.   From EVAL:  Patient reports having chronic low back pain for many years yet progressive worse lately. States intermittent R LE sciatica- not consistent with radicular symptoms but does report constant toothache pain in right side low back. States unsure if anything will  help and meds are not helping. Hasn't really tried heat but not a fan of ice. Reports difficulty with everyday life but still able to do all ADL's but with pain. States does not work and goal is to get some relief.   PERTINENT HISTORY:  History taken from medical chart- office visit from Dr. Ziglar on 10/27 History of Present Illness   Jeffrey Solomon is a 63 year old male who presents with persistent back pain and numbness in the right leg.   He has persistent back pain that has not changed over time. He is taking gabapentin  but is unsure of its effectiveness, as he does not notice any difference in his symptoms. He is confused about his medication, noting an instance where he took two pills without realizing they were the same. This was cyclobenzaprine  5mg .    He reports that he has a history of a slipped disc between the fourth and fifth lumbar vertebrae, which he believes contributes to his back pain. He has not engaged in physical therapy recently, although he recalls participating in therapy several years ago for an unspecified issue.   He experiences numbness and pain radiating down his right leg once or twice a month. He describes episodes where he feels like he might fall when stepping, although he has not fallen yet. He has not noticed significant  improvement in the numbness and pain in his right leg.   He recalls a previous attempt to schedule an MRI, which was canceled due to insurance issues. He has not rescheduled it yet. He mentions a past CT scan of his abdomen and pelvis ordered by a gastroenterologist, Dr. Lamar Ards, which involved drinking a liquid before the scan.  PAIN:  Are you having pain? Yes: NPRS scale: current 7/10; best= 4/10; worst = 9/10 Pain location: Right side low back  Pain description: Sharp, toothache  Aggravating factors: prolonged walking Relieving factors: nothing  PRECAUTIONS: None  RED FLAGS: None   WEIGHT BEARING RESTRICTIONS: No  FALLS:  Has patient fallen in last 6 months? No  LIVING ENVIRONMENT: Lives with: lives with an adult companion Lives in: House/apartment Stairs: Yes: External: 2.5 steps; on left going up Has following equipment at home: [name] - 2 wheeled  OCCUPATION: Disabled   PLOF: Mother assist with meals. Still driving  PATIENT GOALS: Decrease swelling   NEXT MD VISIT: Jul 21, 2024 with Dr. Onita  OBJECTIVE:  Note: Objective measures were completed at Evaluation unless otherwise noted.  DIAGNOSTIC FINDINGS:  EXAM: Magnetic resonance imaging, spinal canal and contents, lumbar, without contrast material. DATE: 01/03/2022 12:39 PM ACCESSION: [id number] UN DICTATED: 01/03/2022 12:53 PM INTERPRETATION LOCATION: 54.  CLINICAL INDICATION: 63 years old Male with right lumbar radiculopathy x 6 months ; Lumbar radiculopathy, symptoms persist with > 6 wks treatment  - M54.16 - Right lumbar radiculopathy - M54.9 - Back pain, unspecified back location, unspecified back pain laterality, unspecified chronicity    COMPARISON: None  TECHNIQUE: Multiplanar MRI was performed through the lumbar spine without intravenous contrast.  FINDINGS: Bone marrow signal intensity is normal. The visualized cord is unremarkable and the conus medullaris ends at a normal level.  The T12-L1  disc space is within normal limits.  The L1-L2 disc space is within normal limits.  The L2-L3 disc space demonstrates minimal desiccation of the disc as well as a diffuse bulging disc. There is degeneration of facet joints resulting in a borderline spinal canal diameter. The neuroforamina are widely patent.  These L3-L4 disc space  shows desiccation of the disc and a diffuse disc bulge. The facet joints are degenerated. There is no evidence of spinal canal diameter. There is moderate narrowing of the neuroforamina bilaterally and symmetrically.  The L4-L5 disc space demonstrates desiccation of the disc. There is a small posterior annular fissure. The disc is diffusely bulging is no evidence of spinal canal narrowing. The facet joints are degenerated bilaterally. The neural foramina are moderately to severely stenotic bilaterally and symmetrically.  The L5-S1 disc space demonstrates desiccation of the disc as well as a diffuse disc bulge which is asymmetrical and predominantly to the left. This results in moderate narrowing of the left neuroforamen. The right-sided neuroforamen is normal. The diameter of the spinal canal is normal to. The paraspinal tissues are within normal limits.  For the purposes of this dictation, the lowest well formed intervertebral disc space is assumed to be the L5-S1 level, and there are presumed to be five lumbar-type vertebral bodies. Procedure Note  Thalia Males, MD - 01/03/2022 Formatting of this note might be different from the original. EXAM: Magnetic resonance imaging, spinal canal and contents, lumbar, without contrast material. DATE: 01/03/2022 12:39 PM ACCESSION: [id number] UN DICTATED: 01/03/2022 12:53 PM INTERPRETATION LOCATION: 54.  CLINICAL INDICATION: 64 years old Male with right lumbar radiculopathy x 6 months ; Lumbar radiculopathy, symptoms persist with > 6 wks treatment  - M54.16 - Right lumbar radiculopathy - M54.9 - Back pain, unspecified back  location, unspecified back pain laterality, unspecified chronicity    COMPARISON: None  TECHNIQUE: Multiplanar MRI was performed through the lumbar spine without intravenous contrast.  FINDINGS: Bone marrow signal intensity is normal. The visualized cord is unremarkable and the conus medullaris ends at a normal level.  The T12-L1 disc space is within normal limits.  The L1-L2 disc space is within normal limits.  The L2-L3 disc space demonstrates minimal desiccation of the disc as well as a diffuse bulging disc. There is degeneration of facet joints resulting in a borderline spinal canal diameter. The neuroforamina are widely patent.  These L3-L4 disc space shows desiccation of the disc and a diffuse disc bulge. The facet joints are degenerated. There is no evidence of spinal canal diameter. There is moderate narrowing of the neuroforamina bilaterally and symmetrically.  The L4-L5 disc space demonstrates desiccation of the disc. There is a small posterior annular fissure. The disc is diffusely bulging is no evidence of spinal canal narrowing. The facet joints are degenerated bilaterally. The neural foramina are moderately to severely stenotic bilaterally and symmetrically.  The L5-S1 disc space demonstrates desiccation of the disc as well as a diffuse disc bulge which is asymmetrical and predominantly to the left. This results in moderate narrowing of the left neuroforamen. The right-sided neuroforamen is normal. The diameter of the spinal canal is normal to. The paraspinal tissues are within normal limits.  For the purposes of this dictation, the lowest well formed intervertebral disc space is assumed to be the L5-S1 level, and there are presumed to be five lumbar-type vertebral bodies.  IMPRESSION: Mild degenerative changes no evidence of significant spinal canal stenosis. Exam End: 01/03/22 12:39   Specimen Collected: 01/03/22 12:53 Last Resulted: 01/03/22 12:57  Received From: [facility]  Result Received: 08/12/23 13:11    PATIENT SURVEYS:  Modified Oswestry:  MODIFIED OSWESTRY DISABILITY SCALE  Date: 04/27/2024 Score  Pain intensity 4 =  Pain medication provides me with little relief from pain.  2. Personal care (washing, dressing, etc.) 2 =  It is  painful to take care of myself, and I am slow and careful.  3. Lifting 3 = Pain prevents me from lifting heavy weights, but I can manage light to medium weights if they are conveniently positioned  4. Walking 3 =  Pain prevents me from walking more than  mile.  5. Sitting 2 =  Pain prevents me from sitting more than 1 hour.  6. Standing 3 =  Pain prevents me from standing more than 1/2 hour.  7. Sleeping 0 = Pain does not prevent me from sleeping well.  8. Social Life 3 =  Pain prevents me from going out very often.  9. Traveling 3 = My pain restricts my travel over 1 hour  10. Employment/ Homemaking 2 = I can perform most of my homemaking/job duties, but pain prevents me from performing more physically stressful activities (eg, lifting, vacuuming).  Total 25/50 or 50%   Interpretation of scores: Score Category Description  0-20% Minimal Disability The patient can cope with most living activities. Usually no treatment is indicated apart from advice on lifting, sitting and exercise  21-40% Moderate Disability The patient experiences more pain and difficulty with sitting, lifting and standing. Travel and social life are more difficult and they may be disabled from work. Personal care, sexual activity and sleeping are not grossly affected, and the patient can usually be managed by conservative means  41-60% Severe Disability Pain remains the main problem in this group, but activities of daily living are affected. These patients require a detailed investigation  61-80% Crippled Back pain impinges on all aspects of the patient's life. Positive intervention is required  81-100% Bed-bound These patients are either bed-bound  or exaggerating their symptoms  Bluford FORBES Zoe DELENA Karon DELENA, et al. Surgery versus conservative management of stable thoracolumbar fracture: the PRESTO feasibility RCT. [place] ([place]): [employer]; 2021 Nov. [facility] Technology Assessment, No. 25.62.) Appendix 3, Oswestry Disability Index category descriptors. Available from: Findjewelers.cz  Minimally Clinically Important Difference (MCID) = 12.8%  COGNITION: Overall cognitive status: Within functional limits for tasks assessed     SENSATION: Light touch: Impaired   MUSCLE LENGTH: Hamstrings: Right 70 deg; Left 82 deg   POSTURE: increased lumbar lordosis  PALPATION: (+) edema R low back just superior to SI region- (+) for pain  LUMBAR ROM:   AROM eval  Flexion *Fingertips to ankle  Extension *WFL  Right lateral flexion *Fingertips to lateral fibular head  Left lateral flexion *Fingertips to lateral knee joint  Right rotation *Full yet painful  Left rotation *Full yet painful   (Blank rows = not tested) (*= painful ROM)  LOWER EXTREMITY ROM:     Active  Right eval Left eval  Hip flexion    Hip extension    Hip abduction    Hip adduction    Hip internal rotation    Hip external rotation    Knee flexion    Knee extension    Ankle dorsiflexion    Ankle plantarflexion    Ankle inversion    Ankle eversion     (Blank rows = not tested)  LOWER EXTREMITY MMT:    MMT Right eval Left eval  Hip flexion 3+ 4  Hip extension    Hip abduction 3+ 4  Hip adduction 3+ 4  Hip internal rotation 3+ 4  Hip external rotation 3+ 4  Knee flexion 4 4  Knee extension 4 4  Ankle dorsiflexion 4+ 5  Ankle plantarflexion    Ankle inversion  Ankle eversion     (Blank rows = not tested)  LUMBAR SPECIAL TESTS:  Straight leg raise test: Negative, Slump test: Positive, and SI Compression/distraction test: Negative  FUNCTIONAL TESTS:  5 times sit to stand: 24.3 sec with min UE  support  GAIT: Distance walked: 50 feet Assistive device utilized: None Level of assistance: Complete Independence Comments: Antalgic with decreased step length and poor heel to toe  TREATMENT DATE: 04/27/2024  *Moist heat to low back while performing low back stretching today.     Therex:  Supine (hooklye) with Hip IR/ER mobs x 30 each Side Lower trunk rotation- Hold 30 sec x 5 each Side Single knee to chest- hold 30 sec x 3 each side Supine piriformis stretch- hold 30 sec x 3  each Side Supine figure 4 stretch - Hold 30 sec x 3 each Side Supine HS stretch w strap - hold 30 sec x 3 ea  Supine bridge 2 x 10 reps (VC for height)  Seated hs stretch- hold 30 sec x 2  Manual therapy:  IASTM (the Stick) to right sided lumbar region x 8 min                                                                                                             PATIENT EDUCATION:  Education details: Purpose of PT and proposed plan of care; and See above self care section Person educated: Patient Education method: Explanation Education comprehension: verbalized understanding  HOME EXERCISE PROGRAM: Access Code: [email or web address] URL: [email or web address] Date: 04/30/2024 Prepared by: Reyes London  Exercises - Supine Single Knee to Chest Stretch  - 1 x daily - 3 sets - 30 hold - Supine Lower Trunk Rotation  - 1 x daily - 3 sets - 30 sec hold - Supine Piriformis Stretch with Foot on Ground  - 1 x daily - 3 sets - 30 sec hold - Supine Bridge  - 1 x daily - 3 sets - 10 reps - Seated Lumbar Flexion Stretch  - 1 x daily - 3 sets - 30 hold - Seated Figure 4 Piriformis Stretch  - 1 x daily - 3 sets - 30 hold - Seated Piriformis Stretch  - 1 x daily - 3 sets - 30 hold - Supine Figure 4 Piriformis Stretch  - 1 x daily - 3 sets - 30 hold   ASSESSMENT:  CLINICAL IMPRESSION: Patient is a 63 y.o. male who was seen today for physical therapy treatment for Chronic low back pain with  Right sided sciatica. Patient continues to respond well to initial visits. He was able to participate in some LE mobility and low back ROM activities without report of any increased pain. He was responsive to all VC and able to move better after session- stating feeling looser after stretching. Will transition to more core stabilization next visit if pain under control. Patient will benefit from skilled PT services to address his pain, immobility and return to pain-free function at home and work.   OBJECTIVE IMPAIRMENTS: Abnormal gait, decreased activity tolerance, decreased  balance, decreased coordination, decreased endurance, decreased knowledge of use of DME, decreased mobility, difficulty walking, decreased ROM, decreased strength, decreased safety awareness, impaired perceived functional ability, impaired flexibility, impaired sensation, postural dysfunction, obesity, and pain.   ACTIVITY LIMITATIONS: carrying, lifting, bending, sitting, standing, squatting, stairs, transfers, bed mobility, and dressing  PARTICIPATION LIMITATIONS: meal prep, cleaning, laundry, shopping, community activity, and yard work  PERSONAL FACTORS: 3+ comorbidities: DM, Neuropathy, Crohn's, HTN, SAH are also affecting patient's functional outcome.   REHAB POTENTIAL: Good  CLINICAL DECISION MAKING: Evolving/moderate complexity  EVALUATION COMPLEXITY: Moderate   GOALS: Goals reviewed with patient? Yes  SHORT TERM GOALS: Target date: 06/04/2024  Pt will be independent with HEP in order to improve strength and decrease back pain in order to improve pain-free function at home and work. Baseline: 04/27/2024= no formal HEP in place Goal status: INITIAL     LONG TERM GOALS: Target date: 07/20/2024  Pt will decrease mODI score by at least 13 points in order demonstrate clinically significant reduction in back pain/disability.  Baseline: 04/27/2024 Goal status: INITIAL  2.  Pt will decrease worst back pain as  reported on NPRS by at least 2 points in order to demonstrate clinically significant reduction in back pain.  Baseline:  Goal status: INITIAL  3.  Pt will increase strength of by at least 1/2 MMT grade in order to demonstrate improvement in strength and function. Baseline: 04/27/2024= See above chart Goal status: INITIAL  4.  Patient will report ability to walk > 1/4 mile without reset and < 7/10 low back pain for  improved community distances.  Baseline: 04/27/2024= Pain prevents from walking 1/4 mi Goal status: INITIAL  PLAN:  PT FREQUENCY: 1-2x/week  PT DURATION: 12 weeks  PLANNED INTERVENTIONS: 97164- PT Re-evaluation, 97750- Physical Performance Testing, 97110-Therapeutic exercises, 97530- Therapeutic activity, [id number]- Neuromuscular re-education, 97535- Self Care, [id number]- Manual therapy, [id number]- Gait training, [phone]- Orthotic Initial, [phone]- Orthotic/Prosthetic subsequent, [phone]- Canalith repositioning, [phone]- Electrical stimulation (manual), [id number]- Ultrasound, 79439 (1-2 muscles), 20561 (3+ muscles)- Dry Needling, Patient/Family education, Balance training, Stair training, Taping, Joint mobilization, Joint manipulation, Spinal manipulation, Spinal mobilization, Vestibular training, DME instructions, Cryotherapy, and Moist heat.  PLAN FOR NEXT SESSION:  Manual therapy for low back pain/ROM Review and progress in HEP  Add more Core stabilization activities    Reyes LOISE London, PT 05/04/2024, 3:32 PM

## 2024-05-07 ENCOUNTER — Ambulatory Visit

## 2024-05-11 ENCOUNTER — Encounter: Payer: Self-pay | Admitting: Physical Therapy

## 2024-05-11 ENCOUNTER — Ambulatory Visit (INDEPENDENT_AMBULATORY_CARE_PROVIDER_SITE_OTHER): Admitting: Physical Therapy

## 2024-05-11 DIAGNOSIS — G8929 Other chronic pain: Secondary | ICD-10-CM | POA: Diagnosis not present

## 2024-05-11 DIAGNOSIS — M5441 Lumbago with sciatica, right side: Secondary | ICD-10-CM

## 2024-05-11 DIAGNOSIS — M6281 Muscle weakness (generalized): Secondary | ICD-10-CM

## 2024-05-11 DIAGNOSIS — R262 Difficulty in walking, not elsewhere classified: Secondary | ICD-10-CM

## 2024-05-11 NOTE — Therapy (Signed)
 OUTPATIENT PHYSICAL THERAPY THORACOLUMBAR TREATMENT   Patient Name: Jeffrey Solomon MRN: [id number] DOB:02-14-1961, 63 y.o., male Today's Date: 05/11/2024  END OF SESSION:  PT End of Session - 05/11/24 1445     Visit Number 4    Number of Visits 24    Date for Recertification  07/20/24    Progress Note Due on Visit 10    PT Start Time 1446    PT Stop Time 1530    PT Time Calculation (min) 44 min           Past Medical History:  Diagnosis Date   Diabetes mellitus without complication (HCC)    Hypertension    History reviewed. No pertinent surgical history. Patient Active Problem List   Diagnosis Date Noted   Foot callus 11/19/2023   Chronic heart failure with preserved ejection fraction (HFpEF) (HCC) 09/23/2023   History of ischemic stroke 09/16/2023   History of malignant neoplasm of thyroid 09/16/2023   Hypoxia 09/16/2023   Ulcerative colitis (HCC) 02/21/2022   Type 2 diabetes with complication (HCC) 06/22/2021   Thyroid cancer (HCC) 05/25/2021   Vitamin D  deficiency 09/29/2020   Hyperlipidemia 02/26/2020   Iron deficiency anemia 02/26/2020   Obesity 02/26/2020   Hyperparathyroidism 09/28/2019   OSA (obstructive sleep apnea) 09/16/2019   SAH (subarachnoid hemorrhage) (HCC) 12/30/2015   Degeneration of lumbar intervertebral disc 08/06/2011   Hypertension, benign 06/28/2010    PCP: Ziglar, Susan, MD  REFERRING PROVIDER: Ziglar, Susan, MD  REFERRING DIAG: [phone] (ICD-10-CM) - Chronic right-sided low back pain with right-sided sciatica   Rationale for Evaluation and Treatment: Rehabilitation  THERAPY DIAG:  Chronic right-sided low back pain with right-sided sciatica  Muscle weakness (generalized)  Difficulty in walking, not elsewhere classified  ONSET DATE: Since age of 71 per patient report  SUBJECTIVE:                                                                                                                                                                                            SUBJECTIVE STATEMENT: From Today: Pt reports it has been one of those days and that the pain in the R lumbar spine is a 9/10. He reports his HEP has been going okay.  From EVAL:  Patient reports having chronic low back pain for many years yet progressive worse lately. States intermittent R LE sciatica- not consistent with radicular symptoms but does report constant toothache pain in right side low back. States unsure if anything will help and meds are not helping. Hasn't really tried heat but not a fan of ice. Reports difficulty with everyday life but still able to  do all ADL's but with pain. States does not work and goal is to get some relief.   PERTINENT HISTORY:  History taken from medical chart- office visit from Dr. Ziglar on 10/27 History of Present Illness   Jeffrey Solomon is a 63 year old male who presents with persistent back pain and numbness in the right leg.   He has persistent back pain that has not changed over time. He is taking gabapentin  but is unsure of its effectiveness, as he does not notice any difference in his symptoms. He is confused about his medication, noting an instance where he took two pills without realizing they were the same. This was cyclobenzaprine  5mg .    He reports that he has a history of a slipped disc between the fourth and fifth lumbar vertebrae, which he believes contributes to his back pain. He has not engaged in physical therapy recently, although he recalls participating in therapy several years ago for an unspecified issue.   He experiences numbness and pain radiating down his right leg once or twice a month. He describes episodes where he feels like he might fall when stepping, although he has not fallen yet. He has not noticed significant improvement in the numbness and pain in his right leg.   He recalls a previous attempt to schedule an MRI, which was canceled due to insurance issues. He has not  rescheduled it yet. He mentions a past CT scan of his abdomen and pelvis ordered by a gastroenterologist, Dr. Lamar Ards, which involved drinking a liquid before the scan.  PAIN:  From today: beginning session 9/10, following heating pad and stretches 7/10, end of session 6/10  Are you having pain? Yes: NPRS scale: current 7/10; best= 4/10; worst = 9/10 Pain location: Right side low back  Pain description: Sharp, toothache  Aggravating factors: prolonged walking Relieving factors: nothing  PRECAUTIONS: None  RED FLAGS: None   WEIGHT BEARING RESTRICTIONS: No  FALLS:  Has patient fallen in last 6 months? No  LIVING ENVIRONMENT: Lives with: lives with an adult companion Lives in: House/apartment Stairs: Yes: External: 2.5 steps; on left going up Has following equipment at home: [name] - 2 wheeled  OCCUPATION: Disabled   PLOF: Mother assist with meals. Still driving  PATIENT GOALS: Decrease swelling   NEXT MD VISIT: Jul 21, 2024 with Dr. Onita  OBJECTIVE:  Note: Objective measures were completed at Evaluation unless otherwise noted.  DIAGNOSTIC FINDINGS:  EXAM: Magnetic resonance imaging, spinal canal and contents, lumbar, without contrast material. DATE: 01/03/2022 12:39 PM ACCESSION: [id number] UN DICTATED: 01/03/2022 12:53 PM INTERPRETATION LOCATION: 54.  CLINICAL INDICATION: 63 years old Male with right lumbar radiculopathy x 6 months ; Lumbar radiculopathy, symptoms persist with > 6 wks treatment  - M54.16 - Right lumbar radiculopathy - M54.9 - Back pain, unspecified back location, unspecified back pain laterality, unspecified chronicity    COMPARISON: None  TECHNIQUE: Multiplanar MRI was performed through the lumbar spine without intravenous contrast.  FINDINGS: Bone marrow signal intensity is normal. The visualized cord is unremarkable and the conus medullaris ends at a normal level.  The T12-L1 disc space is within normal limits.  The L1-L2 disc space is  within normal limits.  The L2-L3 disc space demonstrates minimal desiccation of the disc as well as a diffuse bulging disc. There is degeneration of facet joints resulting in a borderline spinal canal diameter. The neuroforamina are widely patent.  These L3-L4 disc space shows desiccation of the disc and a diffuse disc  bulge. The facet joints are degenerated. There is no evidence of spinal canal diameter. There is moderate narrowing of the neuroforamina bilaterally and symmetrically.  The L4-L5 disc space demonstrates desiccation of the disc. There is a small posterior annular fissure. The disc is diffusely bulging is no evidence of spinal canal narrowing. The facet joints are degenerated bilaterally. The neural foramina are moderately to severely stenotic bilaterally and symmetrically.  The L5-S1 disc space demonstrates desiccation of the disc as well as a diffuse disc bulge which is asymmetrical and predominantly to the left. This results in moderate narrowing of the left neuroforamen. The right-sided neuroforamen is normal. The diameter of the spinal canal is normal to. The paraspinal tissues are within normal limits.  For the purposes of this dictation, the lowest well formed intervertebral disc space is assumed to be the L5-S1 level, and there are presumed to be five lumbar-type vertebral bodies. Procedure Note  Thalia Males, MD - 01/03/2022 Formatting of this note might be different from the original. EXAM: Magnetic resonance imaging, spinal canal and contents, lumbar, without contrast material. DATE: 01/03/2022 12:39 PM ACCESSION: [id number] UN DICTATED: 01/03/2022 12:53 PM INTERPRETATION LOCATION: 54.  CLINICAL INDICATION: 63 years old Male with right lumbar radiculopathy x 6 months ; Lumbar radiculopathy, symptoms persist with > 6 wks treatment  - M54.16 - Right lumbar radiculopathy - M54.9 - Back pain, unspecified back location, unspecified back pain laterality, unspecified  chronicity    COMPARISON: None  TECHNIQUE: Multiplanar MRI was performed through the lumbar spine without intravenous contrast.  FINDINGS: Bone marrow signal intensity is normal. The visualized cord is unremarkable and the conus medullaris ends at a normal level.  The T12-L1 disc space is within normal limits.  The L1-L2 disc space is within normal limits.  The L2-L3 disc space demonstrates minimal desiccation of the disc as well as a diffuse bulging disc. There is degeneration of facet joints resulting in a borderline spinal canal diameter. The neuroforamina are widely patent.  These L3-L4 disc space shows desiccation of the disc and a diffuse disc bulge. The facet joints are degenerated. There is no evidence of spinal canal diameter. There is moderate narrowing of the neuroforamina bilaterally and symmetrically.  The L4-L5 disc space demonstrates desiccation of the disc. There is a small posterior annular fissure. The disc is diffusely bulging is no evidence of spinal canal narrowing. The facet joints are degenerated bilaterally. The neural foramina are moderately to severely stenotic bilaterally and symmetrically.  The L5-S1 disc space demonstrates desiccation of the disc as well as a diffuse disc bulge which is asymmetrical and predominantly to the left. This results in moderate narrowing of the left neuroforamen. The right-sided neuroforamen is normal. The diameter of the spinal canal is normal to. The paraspinal tissues are within normal limits.  For the purposes of this dictation, the lowest well formed intervertebral disc space is assumed to be the L5-S1 level, and there are presumed to be five lumbar-type vertebral bodies.  IMPRESSION: Mild degenerative changes no evidence of significant spinal canal stenosis. Exam End: 01/03/22 12:39   Specimen Collected: 01/03/22 12:53 Last Resulted: 01/03/22 12:57  Received From: [facility]  Result Received: 08/12/23 13:11    PATIENT  SURVEYS:  Modified Oswestry:  MODIFIED OSWESTRY DISABILITY SCALE  Date: 04/27/2024 Score  Pain intensity 4 =  Pain medication provides me with little relief from pain.  2. Personal care (washing, dressing, etc.) 2 =  It is painful to take care of myself, and I am  slow and careful.  3. Lifting 3 = Pain prevents me from lifting heavy weights, but I can manage light to medium weights if they are conveniently positioned  4. Walking 3 =  Pain prevents me from walking more than  mile.  5. Sitting 2 =  Pain prevents me from sitting more than 1 hour.  6. Standing 3 =  Pain prevents me from standing more than 1/2 hour.  7. Sleeping 0 = Pain does not prevent me from sleeping well.  8. Social Life 3 =  Pain prevents me from going out very often.  9. Traveling 3 = My pain restricts my travel over 1 hour  10. Employment/ Homemaking 2 = I can perform most of my homemaking/job duties, but pain prevents me from performing more physically stressful activities (eg, lifting, vacuuming).  Total 25/50 or 50%   Interpretation of scores: Score Category Description  0-20% Minimal Disability The patient can cope with most living activities. Usually no treatment is indicated apart from advice on lifting, sitting and exercise  21-40% Moderate Disability The patient experiences more pain and difficulty with sitting, lifting and standing. Travel and social life are more difficult and they may be disabled from work. Personal care, sexual activity and sleeping are not grossly affected, and the patient can usually be managed by conservative means  41-60% Severe Disability Pain remains the main problem in this group, but activities of daily living are affected. These patients require a detailed investigation  61-80% Crippled Back pain impinges on all aspects of the patient's life. Positive intervention is required  81-100% Bed-bound These patients are either bed-bound or exaggerating their symptoms  Bluford FORBES Zoe DELENA Karon DELENA, et al. Surgery versus conservative management of stable thoracolumbar fracture: the PRESTO feasibility RCT. [place] ([place]): [employer]; 2021 Nov. [facility] Technology Assessment, No. 25.62.) Appendix 3, Oswestry Disability Index category descriptors. Available from: Findjewelers.cz  Minimally Clinically Important Difference (MCID) = 12.8%  COGNITION: Overall cognitive status: Within functional limits for tasks assessed     SENSATION: Light touch: Impaired   MUSCLE LENGTH: Hamstrings: Right 70 deg; Left 82 deg   POSTURE: increased lumbar lordosis  PALPATION: (+) edema R low back just superior to SI region- (+) for pain  LUMBAR ROM:   AROM eval  Flexion *Fingertips to ankle  Extension *WFL  Right lateral flexion *Fingertips to lateral fibular head  Left lateral flexion *Fingertips to lateral knee joint  Right rotation *Full yet painful  Left rotation *Full yet painful   (Blank rows = not tested) (*= painful ROM)  LOWER EXTREMITY ROM:     Active  Right eval Left eval  Hip flexion    Hip extension    Hip abduction    Hip adduction    Hip internal rotation    Hip external rotation    Knee flexion    Knee extension    Ankle dorsiflexion    Ankle plantarflexion    Ankle inversion    Ankle eversion     (Blank rows = not tested)  LOWER EXTREMITY MMT:    MMT Right eval Left eval  Hip flexion 3+ 4  Hip extension    Hip abduction 3+ 4  Hip adduction 3+ 4  Hip internal rotation 3+ 4  Hip external rotation 3+ 4  Knee flexion 4 4  Knee extension 4 4  Ankle dorsiflexion 4+ 5  Ankle plantarflexion    Ankle inversion    Ankle eversion     (  Blank rows = not tested)  LUMBAR SPECIAL TESTS:  Straight leg raise test: Negative, Slump test: Positive, and SI Compression/distraction test: Negative  FUNCTIONAL TESTS:  5 times sit to stand: 24.3 sec with min UE support  GAIT: Distance walked: 50 feet Assistive device  utilized: None Level of assistance: Complete Independence Comments: Antalgic with decreased step length and poor heel to toe  TREATMENT DATE: 04/27/2024  *Moist heat placed under pt R lower back during stretching exercises*   supine lower trunk rotation, 3x30s hold each side SKTC, 30sx3 hold each side supine figure 4 stretch w/ pt provided overpressure, 2x30s hold each side supine bridges 2x10, reports kinda hard supine marches 2x10ea  SLR x10 ea STM and TPR w/ patient in sidelying to R QL and R lumbar paraspinals-   PATIENT EDUCATION:  Education details: Purpose of PT and proposed plan of care; and See above self care section; heating pad and ice pack safety and use Person educated: Patient Education method: Explanation Education comprehension: verbalized understanding  HOME EXERCISE PROGRAM: Access Code: [email or web address] URL: [email or web address] Date: 04/30/2024 Prepared by: Reyes London  Exercises - Supine Single Knee to Chest Stretch  - 1 x daily - 3 sets - 30 hold - Supine Lower Trunk Rotation  - 1 x daily - 3 sets - 30 sec hold - Supine Piriformis Stretch with Foot on Ground  - 1 x daily - 3 sets - 30 sec hold - Supine Bridge  - 1 x daily - 3 sets - 10 reps - Seated Lumbar Flexion Stretch  - 1 x daily - 3 sets - 30 hold - Seated Figure 4 Piriformis Stretch  - 1 x daily - 3 sets - 30 hold - Seated Piriformis Stretch  - 1 x daily - 3 sets - 30 hold - Supine Figure 4 Piriformis Stretch  - 1 x daily - 3 sets - 30 hold   ASSESSMENT:  CLINICAL IMPRESSION:  Pt reported decrease in pain from 9/10 to 7/10 following of heating pad under R lumbar spine and LTR, figure 4, and SKTC stretches. Pt showed good form and recognition of HEP exercises performed in this session. Introduced supine marches and SLR to activate core muscles for stabilization and reduction of pain of the lumbar spine, and pt showed proper form of newer exercises following  demonstration and tactile cuing and responded well. Pt reported minor fatigue and that the supine bridges were kinda hard. Pt reported decrease in pain and showed positive response to STM and TPR to R lumbar region at end of session. Pt was educated on proper use of heating pas and cold pack at home in order for maximum effectiveness and skin safety. Pt will continue to benefit from skilled therapy to address remaining deficits in order to improve overall QoL and return to PLOF.   OBJECTIVE IMPAIRMENTS: Abnormal gait, decreased activity tolerance, decreased balance, decreased coordination, decreased endurance, decreased knowledge of use of DME, decreased mobility, difficulty walking, decreased ROM, decreased strength, decreased safety awareness, impaired perceived functional ability, impaired flexibility, impaired sensation, postural dysfunction, obesity, and pain.   ACTIVITY LIMITATIONS: carrying, lifting, bending, sitting, standing, squatting, stairs, transfers, bed mobility, and dressing  PARTICIPATION LIMITATIONS: meal prep, cleaning, laundry, shopping, community activity, and yard work  PERSONAL FACTORS: 3+ comorbidities: DM, Neuropathy, Crohn's, HTN, SAH are also affecting patient's functional outcome.   REHAB POTENTIAL: Good  CLINICAL DECISION MAKING: Evolving/moderate complexity  EVALUATION COMPLEXITY: Moderate   GOALS: Goals reviewed with patient? Yes  SHORT TERM  GOALS: Target date: 06/04/2024  Pt will be independent with HEP in order to improve strength and decrease back pain in order to improve pain-free function at home and work. Baseline: 04/27/2024= no formal HEP in place Goal status: INITIAL     LONG TERM GOALS: Target date: 07/20/2024  Pt will decrease mODI score by at least 13 points in order demonstrate clinically significant reduction in back pain/disability.  Baseline: 04/27/2024 Goal status: INITIAL  2.  Pt will decrease worst back pain as reported on NPRS by at  least 2 points in order to demonstrate clinically significant reduction in back pain.  Baseline:  Goal status: INITIAL  3.  Pt will increase strength of by at least 1/2 MMT grade in order to demonstrate improvement in strength and function. Baseline: 04/27/2024= See above chart Goal status: INITIAL  4.  Patient will report ability to walk > 1/4 mile without reset and < 7/10 low back pain for  improved community distances.  Baseline: 04/27/2024= Pain prevents from walking 1/4 mi Goal status: INITIAL  PLAN:  PT FREQUENCY: 1-2x/week  PT DURATION: 12 weeks  PLANNED INTERVENTIONS: 97164- PT Re-evaluation, 97750- Physical Performance Testing, 97110-Therapeutic exercises, 97530- Therapeutic activity, [id number]- Neuromuscular re-education, 97535- Self Care, [id number]- Manual therapy, [id number]- Gait training, [phone]- Orthotic Initial, [phone]- Orthotic/Prosthetic subsequent, [phone]- Canalith repositioning, [phone]- Electrical stimulation (manual), [id number]- Ultrasound, 79439 (1-2 muscles), 20561 (3+ muscles)- Dry Needling, Patient/Family education, Balance training, Stair training, Taping, Joint mobilization, Joint manipulation, Spinal manipulation, Spinal mobilization, Vestibular training, DME instructions, Cryotherapy, and Moist heat.  PLAN FOR NEXT SESSION:  Continue manual therapy for low back pain/ROM Progress HEP  Progress core stabilization activities    Renna Helling, SPT  05/11/2024, 2:46 PM

## 2024-05-19 ENCOUNTER — Ambulatory Visit

## 2024-05-19 DIAGNOSIS — R262 Difficulty in walking, not elsewhere classified: Secondary | ICD-10-CM

## 2024-05-19 DIAGNOSIS — G8929 Other chronic pain: Secondary | ICD-10-CM

## 2024-05-19 DIAGNOSIS — M6281 Muscle weakness (generalized): Secondary | ICD-10-CM

## 2024-05-19 DIAGNOSIS — M5441 Lumbago with sciatica, right side: Secondary | ICD-10-CM | POA: Diagnosis not present

## 2024-05-19 NOTE — Therapy (Signed)
 OUTPATIENT PHYSICAL THERAPY THORACOLUMBAR TREATMENT   Patient Name: Jeffrey Solomon MRN: [id number] DOB:Jan 17, 1961, 63 y.o., male Today's Date: 05/19/2024  END OF SESSION:  PT End of Session - 05/19/24 1439     Visit Number 5    Number of Visits 24    Date for Recertification  07/20/24    Progress Note Due on Visit 10    PT Start Time 1403    PT Stop Time 1446    PT Time Calculation (min) 43 min    Activity Tolerance Patient tolerated treatment well;No increased pain    Behavior During Therapy WFL for tasks assessed/performed            Past Medical History:  Diagnosis Date   Diabetes mellitus without complication (HCC)    Hypertension    History reviewed. No pertinent surgical history. Patient Active Problem List   Diagnosis Date Noted   Foot callus 11/19/2023   Chronic heart failure with preserved ejection fraction (HFpEF) (HCC) 09/23/2023   History of ischemic stroke 09/16/2023   History of malignant neoplasm of thyroid 09/16/2023   Hypoxia 09/16/2023   Ulcerative colitis (HCC) 02/21/2022   Type 2 diabetes with complication (HCC) 06/22/2021   Thyroid cancer (HCC) 05/25/2021   Vitamin D  deficiency 09/29/2020   Hyperlipidemia 02/26/2020   Iron deficiency anemia 02/26/2020   Obesity 02/26/2020   Hyperparathyroidism 09/28/2019   OSA (obstructive sleep apnea) 09/16/2019   SAH (subarachnoid hemorrhage) (HCC) 12/30/2015   Degeneration of lumbar intervertebral disc 08/06/2011   Hypertension, benign 06/28/2010    PCP: Ziglar, Susan, MD  REFERRING PROVIDER: Ziglar, Susan, MD  REFERRING DIAG: [phone] (ICD-10-CM) - Chronic right-sided low back pain with right-sided sciatica   Rationale for Evaluation and Treatment: Rehabilitation  THERAPY DIAG:  Chronic right-sided low back pain with right-sided sciatica  Muscle weakness (generalized)  Difficulty in walking, not elsewhere classified  ONSET DATE: Since age of 54 per patient report  SUBJECTIVE:                                                                                                                                                                                            SUBJECTIVE STATEMENT:   From Today: Patient reports doing okay today with no significant changes. He states having 6-7/10 pain mainly on R side of low back. Denies any pain down the legs.   From EVAL:  Patient reports having chronic low back pain for many years yet progressive worse lately. States intermittent R LE sciatica- not consistent with radicular symptoms but does report constant toothache pain in right side low back. States unsure if anything will help and  meds are not helping. Hasn't really tried heat but not a fan of ice. Reports difficulty with everyday life but still able to do all ADL's but with pain. States does not work and goal is to get some relief.   PERTINENT HISTORY:  History taken from medical chart- office visit from Dr. Ziglar on 10/27 History of Present Illness   Jeffrey Solomon is a 63 year old male who presents with persistent back pain and numbness in the right leg.   He has persistent back pain that has not changed over time. He is taking gabapentin  but is unsure of its effectiveness, as he does not notice any difference in his symptoms. He is confused about his medication, noting an instance where he took two pills without realizing they were the same. This was cyclobenzaprine  5mg .    He reports that he has a history of a slipped disc between the fourth and fifth lumbar vertebrae, which he believes contributes to his back pain. He has not engaged in physical therapy recently, although he recalls participating in therapy several years ago for an unspecified issue.   He experiences numbness and pain radiating down his right leg once or twice a month. He describes episodes where he feels like he might fall when stepping, although he has not fallen yet. He has not noticed significant  improvement in the numbness and pain in his right leg.   He recalls a previous attempt to schedule an MRI, which was canceled due to insurance issues. He has not rescheduled it yet. He mentions a past CT scan of his abdomen and pelvis ordered by a gastroenterologist, Dr. Lamar Ards, which involved drinking a liquid before the scan.  PAIN:  From today: beginning session 9/10, following heating pad and stretches 7/10, end of session 6/10  Are you having pain? Yes: NPRS scale: current 7/10; best= 4/10; worst = 9/10 Pain location: Right side low back  Pain description: Sharp, toothache  Aggravating factors: prolonged walking Relieving factors: nothing  PRECAUTIONS: None  RED FLAGS: None   WEIGHT BEARING RESTRICTIONS: No  FALLS:  Has patient fallen in last 6 months? No  LIVING ENVIRONMENT: Lives with: lives with an adult companion Lives in: House/apartment Stairs: Yes: External: 2.5 steps; on left going up Has following equipment at home: [name] - 2 wheeled  OCCUPATION: Disabled   PLOF: Mother assist with meals. Still driving  PATIENT GOALS: Decrease swelling   NEXT MD VISIT: Jul 21, 2024 with Dr. Onita  OBJECTIVE:  Note: Objective measures were completed at Evaluation unless otherwise noted.  DIAGNOSTIC FINDINGS:  EXAM: Magnetic resonance imaging, spinal canal and contents, lumbar, without contrast material. DATE: 01/03/2022 12:39 PM ACCESSION: [id number] UN DICTATED: 01/03/2022 12:53 PM INTERPRETATION LOCATION: 54.  CLINICAL INDICATION: 63 years old Male with right lumbar radiculopathy x 6 months ; Lumbar radiculopathy, symptoms persist with > 6 wks treatment  - M54.16 - Right lumbar radiculopathy - M54.9 - Back pain, unspecified back location, unspecified back pain laterality, unspecified chronicity    COMPARISON: None  TECHNIQUE: Multiplanar MRI was performed through the lumbar spine without intravenous contrast.  FINDINGS: Bone marrow signal intensity is  normal. The visualized cord is unremarkable and the conus medullaris ends at a normal level.  The T12-L1 disc space is within normal limits.  The L1-L2 disc space is within normal limits.  The L2-L3 disc space demonstrates minimal desiccation of the disc as well as a diffuse bulging disc. There is degeneration of facet joints resulting in a  borderline spinal canal diameter. The neuroforamina are widely patent.  These L3-L4 disc space shows desiccation of the disc and a diffuse disc bulge. The facet joints are degenerated. There is no evidence of spinal canal diameter. There is moderate narrowing of the neuroforamina bilaterally and symmetrically.  The L4-L5 disc space demonstrates desiccation of the disc. There is a small posterior annular fissure. The disc is diffusely bulging is no evidence of spinal canal narrowing. The facet joints are degenerated bilaterally. The neural foramina are moderately to severely stenotic bilaterally and symmetrically.  The L5-S1 disc space demonstrates desiccation of the disc as well as a diffuse disc bulge which is asymmetrical and predominantly to the left. This results in moderate narrowing of the left neuroforamen. The right-sided neuroforamen is normal. The diameter of the spinal canal is normal to. The paraspinal tissues are within normal limits.  For the purposes of this dictation, the lowest well formed intervertebral disc space is assumed to be the L5-S1 level, and there are presumed to be five lumbar-type vertebral bodies. Procedure Note  Thalia Males, MD - 01/03/2022 Formatting of this note might be different from the original. EXAM: Magnetic resonance imaging, spinal canal and contents, lumbar, without contrast material. DATE: 01/03/2022 12:39 PM ACCESSION: [id number] UN DICTATED: 01/03/2022 12:53 PM INTERPRETATION LOCATION: 54.  CLINICAL INDICATION: 63 years old Male with right lumbar radiculopathy x 6 months ; Lumbar radiculopathy, symptoms  persist with > 6 wks treatment  - M54.16 - Right lumbar radiculopathy - M54.9 - Back pain, unspecified back location, unspecified back pain laterality, unspecified chronicity    COMPARISON: None  TECHNIQUE: Multiplanar MRI was performed through the lumbar spine without intravenous contrast.  FINDINGS: Bone marrow signal intensity is normal. The visualized cord is unremarkable and the conus medullaris ends at a normal level.  The T12-L1 disc space is within normal limits.  The L1-L2 disc space is within normal limits.  The L2-L3 disc space demonstrates minimal desiccation of the disc as well as a diffuse bulging disc. There is degeneration of facet joints resulting in a borderline spinal canal diameter. The neuroforamina are widely patent.  These L3-L4 disc space shows desiccation of the disc and a diffuse disc bulge. The facet joints are degenerated. There is no evidence of spinal canal diameter. There is moderate narrowing of the neuroforamina bilaterally and symmetrically.  The L4-L5 disc space demonstrates desiccation of the disc. There is a small posterior annular fissure. The disc is diffusely bulging is no evidence of spinal canal narrowing. The facet joints are degenerated bilaterally. The neural foramina are moderately to severely stenotic bilaterally and symmetrically.  The L5-S1 disc space demonstrates desiccation of the disc as well as a diffuse disc bulge which is asymmetrical and predominantly to the left. This results in moderate narrowing of the left neuroforamen. The right-sided neuroforamen is normal. The diameter of the spinal canal is normal to. The paraspinal tissues are within normal limits.  For the purposes of this dictation, the lowest well formed intervertebral disc space is assumed to be the L5-S1 level, and there are presumed to be five lumbar-type vertebral bodies.  IMPRESSION: Mild degenerative changes no evidence of significant spinal canal stenosis. Exam End:  01/03/22 12:39   Specimen Collected: 01/03/22 12:53 Last Resulted: 01/03/22 12:57  Received From: [facility]  Result Received: 08/12/23 13:11    PATIENT SURVEYS:  Modified Oswestry:  MODIFIED OSWESTRY DISABILITY SCALE  Date: 04/27/2024 Score  Pain intensity 4 =  Pain medication provides me with little relief  from pain.  2. Personal care (washing, dressing, etc.) 2 =  It is painful to take care of myself, and I am slow and careful.  3. Lifting 3 = Pain prevents me from lifting heavy weights, but I can manage light to medium weights if they are conveniently positioned  4. Walking 3 =  Pain prevents me from walking more than  mile.  5. Sitting 2 =  Pain prevents me from sitting more than 1 hour.  6. Standing 3 =  Pain prevents me from standing more than 1/2 hour.  7. Sleeping 0 = Pain does not prevent me from sleeping well.  8. Social Life 3 =  Pain prevents me from going out very often.  9. Traveling 3 = My pain restricts my travel over 1 hour  10. Employment/ Homemaking 2 = I can perform most of my homemaking/job duties, but pain prevents me from performing more physically stressful activities (eg, lifting, vacuuming).  Total 25/50 or 50%   Interpretation of scores: Score Category Description  0-20% Minimal Disability The patient can cope with most living activities. Usually no treatment is indicated apart from advice on lifting, sitting and exercise  21-40% Moderate Disability The patient experiences more pain and difficulty with sitting, lifting and standing. Travel and social life are more difficult and they may be disabled from work. Personal care, sexual activity and sleeping are not grossly affected, and the patient can usually be managed by conservative means  41-60% Severe Disability Pain remains the main problem in this group, but activities of daily living are affected. These patients require a detailed investigation  61-80% Crippled Back pain impinges on all aspects of the  patient's life. Positive intervention is required  81-100% Bed-bound These patients are either bed-bound or exaggerating their symptoms  Bluford FORBES Zoe DELENA Karon DELENA, et al. Surgery versus conservative management of stable thoracolumbar fracture: the PRESTO feasibility RCT. [place] ([place]): [employer]; 2021 Nov. [facility] Technology Assessment, No. 25.62.) Appendix 3, Oswestry Disability Index category descriptors. Available from: [email or web address].cz  Minimally Clinically Important Difference (MCID) = 12.8%  COGNITION: Overall cognitive status: Within functional limits for tasks assessed     SENSATION: Light touch: Impaired   MUSCLE LENGTH: Hamstrings: Right 70 deg; Left 82 deg   POSTURE: increased lumbar lordosis  PALPATION: (+) edema R low back just superior to SI region- (+) for pain  LUMBAR ROM:   AROM eval  Flexion *Fingertips to ankle  Extension *WFL  Right lateral flexion *Fingertips to lateral fibular head  Left lateral flexion *Fingertips to lateral knee joint  Right rotation *Full yet painful  Left rotation *Full yet painful   (Blank rows = not tested) (*= painful ROM)  LOWER EXTREMITY ROM:     Active  Right eval Left eval  Hip flexion    Hip extension    Hip abduction    Hip adduction    Hip internal rotation    Hip external rotation    Knee flexion    Knee extension    Ankle dorsiflexion    Ankle plantarflexion    Ankle inversion    Ankle eversion     (Blank rows = not tested)  LOWER EXTREMITY MMT:    MMT Right eval Left eval  Hip flexion 3+ 4  Hip extension    Hip abduction 3+ 4  Hip adduction 3+ 4  Hip internal rotation 3+ 4  Hip external rotation 3+ 4  Knee flexion 4 4  Knee extension 4  4  Ankle dorsiflexion 4+ 5  Ankle plantarflexion    Ankle inversion    Ankle eversion     (Blank rows = not tested)  LUMBAR SPECIAL TESTS:  Straight leg raise test: Negative, Slump test: Positive, and SI  Compression/distraction test: Negative  FUNCTIONAL TESTS:  5 times sit to stand: 24.3 sec with min UE support  GAIT: Distance walked: 50 feet Assistive device utilized: None Level of assistance: Complete Independence Comments: Antalgic with decreased step length and poor heel to toe  TREATMENT DATE: 04/27/2024  *Moist heat placed under pt R lower back during stretching exercises*   supine lower trunk rotation, 20x3s hold each side supine piriformis stretch 3x30'' each.  supine bridges 2x10, reports kinda hard Physio-ball DKC 2x10 with 5'' holds.  supine marches 2x10ea  Side-lying clamshells RTB 2x10 each. Abdominal isometric with physioball 2x10 with 5'' holds supine SLR 2x10 each. Seated physio-ball rollouts 3 directions 5x each direction with 5'' holds.   PATIENT EDUCATION:  Education details: Pt educated on exercise mechanics/positioning today for improved abdominal contraction/isolation. Person educated: Patient Education method: Explanation Education comprehension: verbalized understanding  HOME EXERCISE PROGRAM: Access Code: [email or web address] URL: [email or web address] Date: 04/30/2024 Prepared by: Reyes London  Exercises - Supine Single Knee to Chest Stretch  - 1 x daily - 3 sets - 30 hold - Supine Lower Trunk Rotation  - 1 x daily - 3 sets - 30 sec hold - Supine Piriformis Stretch with Foot on Ground  - 1 x daily - 3 sets - 30 sec hold - Supine Bridge  - 1 x daily - 3 sets - 10 reps - Seated Lumbar Flexion Stretch  - 1 x daily - 3 sets - 30 hold - Seated Figure 4 Piriformis Stretch  - 1 x daily - 3 sets - 30 hold - Seated Piriformis Stretch  - 1 x daily - 3 sets - 30 hold - Supine Figure 4 Piriformis Stretch  - 1 x daily - 3 sets - 30 hold   ASSESSMENT:  CLINICAL IMPRESSION:  Pt began today's session with LTR in hooklying for improved lumbar mobility which he reports felt pretty good. Lower abdominal strengthening/stability exercises performed  in supine position including bridges, DKC with physioball, and abd. Isometric with physioball. Side-lying clamshells performed to improve pelvic stability. Seated physio-ball rollouts performed at end of treatment for improved lumbar mobility. Patient responded well to verbal and tactile cues for positioning/mechanics. He was noted to continue to have decreased abdominal strength and poor trunk control when instructing PPT requiring increased cuing for proper mechanics/muscle activation. He reported that he felt today's session was really productive and he felt fatigued following today's session. Pt will continue to benefit from skilled therapy to address remaining deficits in order to improve overall QoL and return to PLOF.   OBJECTIVE IMPAIRMENTS: Abnormal gait, decreased activity tolerance, decreased balance, decreased coordination, decreased endurance, decreased knowledge of use of DME, decreased mobility, difficulty walking, decreased ROM, decreased strength, decreased safety awareness, impaired perceived functional ability, impaired flexibility, impaired sensation, postural dysfunction, obesity, and pain.   ACTIVITY LIMITATIONS: carrying, lifting, bending, sitting, standing, squatting, stairs, transfers, bed mobility, and dressing  PARTICIPATION LIMITATIONS: meal prep, cleaning, laundry, shopping, community activity, and yard work  PERSONAL FACTORS: 3+ comorbidities: DM, Neuropathy, Crohn's, HTN, SAH are also affecting patient's functional outcome.   REHAB POTENTIAL: Good  CLINICAL DECISION MAKING: Evolving/moderate complexity  EVALUATION COMPLEXITY: Moderate   GOALS: Goals reviewed with patient? Yes  SHORT TERM GOALS: Target date: 06/04/2024  Pt will be independent with HEP in order to improve strength and decrease back pain in order to improve pain-free function at home and work. Baseline: 04/27/2024= no formal HEP in place Goal status: INITIAL     LONG TERM GOALS: Target date:  07/20/2024  Pt will decrease mODI score by at least 13 points in order demonstrate clinically significant reduction in back pain/disability.  Baseline: 04/27/2024 Goal status: INITIAL  2.  Pt will decrease worst back pain as reported on NPRS by at least 2 points in order to demonstrate clinically significant reduction in back pain.  Baseline:  Goal status: INITIAL  3.  Pt will increase strength of by at least 1/2 MMT grade in order to demonstrate improvement in strength and function. Baseline: 04/27/2024= See above chart Goal status: INITIAL  4.  Patient will report ability to walk > 1/4 mile without reset and < 7/10 low back pain for  improved community distances.  Baseline: 04/27/2024= Pain prevents from walking 1/4 mi Goal status: INITIAL  PLAN:  PT FREQUENCY: 1-2x/week  PT DURATION: 12 weeks  PLANNED INTERVENTIONS: [id number]- PT Re-evaluation, 97750- Physical Performance Testing, 97110-Therapeutic exercises, 97530- Therapeutic activity, [id number]- Neuromuscular re-education, 97535- Self Care, [id number]- Manual therapy, [id number]- Gait training, [phone]- Orthotic Initial, [phone]- Orthotic/Prosthetic subsequent, [phone]- Canalith repositioning, [phone]- Electrical stimulation (manual), [id number]- Ultrasound, [id number] (1-2 muscles), [id number] (3+ muscles)- Dry Needling, Patient/Family education, Balance training, Stair training, Taping, Joint mobilization, Joint manipulation, Spinal manipulation, Spinal mobilization, Vestibular training, DME instructions, Cryotherapy, and Moist heat.  PLAN FOR NEXT SESSION:  Continue manual therapy for low back pain/ROM Progress HEP  Progress core stabilization activities   Norman Sharps, PT, DPT 05/19/2024, 2:59 PM

## 2024-05-24 NOTE — Therapy (Incomplete)
 OUTPATIENT PHYSICAL THERAPY THORACOLUMBAR TREATMENT   Patient Name: Jeffrey Solomon MRN: [id number] DOB:Nov 19, 1960, 63 y.o., male Today's Date: 05/24/2024  END OF SESSION:      Past Medical History:  Diagnosis Date   Diabetes mellitus without complication (HCC)    Hypertension    No past surgical history on file. Patient Active Problem List   Diagnosis Date Noted   Foot callus 11/19/2023   Chronic heart failure with preserved ejection fraction (HFpEF) (HCC) 09/23/2023   History of ischemic stroke 09/16/2023   History of malignant neoplasm of thyroid 09/16/2023   Hypoxia 09/16/2023   Ulcerative colitis (HCC) 02/21/2022   Type 2 diabetes with complication (HCC) 06/22/2021   Thyroid cancer (HCC) 05/25/2021   Vitamin D  deficiency 09/29/2020   Hyperlipidemia 02/26/2020   Iron deficiency anemia 02/26/2020   Obesity 02/26/2020   Hyperparathyroidism 09/28/2019   OSA (obstructive sleep apnea) 09/16/2019   SAH (subarachnoid hemorrhage) (HCC) 12/30/2015   Degeneration of lumbar intervertebral disc 08/06/2011   Hypertension, benign 06/28/2010    PCP: Solomon, Susan, MD  REFERRING PROVIDER: Ziglar, Susan, MD  REFERRING DIAG: [id number] (ICD-10-CM) - Chronic right-sided low back pain with right-sided sciatica   Rationale for Evaluation and Treatment: Rehabilitation  THERAPY DIAG:  No diagnosis found.  ONSET DATE: Since age of 60 per patient report  SUBJECTIVE:                                                                                                                                                                                           SUBJECTIVE STATEMENT:   From Today: ***Patient reports doing okay today with no significant changes. He states having 6-7/10 pain mainly on R side of low back. Denies any pain down the legs.   From EVAL:  Patient reports having chronic low back pain for many years yet progressive worse lately. States intermittent R LE  sciatica- not consistent with radicular symptoms but does report constant toothache pain in right side low back. States unsure if anything will help and meds are not helping. Hasn't really tried heat but not a fan of ice. Reports difficulty with everyday life but still able to do all ADL's but with pain. States does not work and goal is to get some relief.   PERTINENT HISTORY:  History taken from medical chart- office visit from Dr. Ziglar on 10/27 History of Present Illness   Jeffrey Solomon is a 63 year old male who presents with persistent back pain and numbness in the right leg.   He has persistent back pain that has not changed over time. He is taking gabapentin  but is unsure of  its effectiveness, as he does not notice any difference in his symptoms. He is confused about his medication, noting an instance where he took two pills without realizing they were the same. This was cyclobenzaprine  5mg .    He reports that he has a history of a slipped disc between the fourth and fifth lumbar vertebrae, which he believes contributes to his back pain. He has not engaged in physical therapy recently, although he recalls participating in therapy several years ago for an unspecified issue.   He experiences numbness and pain radiating down his right leg once or twice a month. He describes episodes where he feels like he might fall when stepping, although he has not fallen yet. He has not noticed significant improvement in the numbness and pain in his right leg.   He recalls a previous attempt to schedule an MRI, which was canceled due to insurance issues. He has not rescheduled it yet. He mentions a past CT scan of his abdomen and pelvis ordered by a gastroenterologist, Dr. Lamar Solomon, which involved drinking a liquid before the scan.  PAIN:  From today: beginning session 9/10, following heating pad and stretches 7/10, end of session 6/10  Are you having pain? Yes: NPRS scale: current 7/10; best=  4/10; worst = 9/10 Pain location: Right side low back  Pain description: Sharp, toothache  Aggravating factors: prolonged walking Relieving factors: nothing  PRECAUTIONS: None  RED FLAGS: None   WEIGHT BEARING RESTRICTIONS: No  FALLS:  Has patient fallen in last 6 months? No  LIVING ENVIRONMENT: Lives with: lives with an adult companion Lives in: House/apartment Stairs: Yes: External: 2.5 steps; on left going up Has following equipment at home: [name] - 2 wheeled  OCCUPATION: Disabled   PLOF: Mother assist with meals. Still driving  PATIENT GOALS: Decrease swelling   NEXT MD VISIT: Jul 21, 2024 with Dr. Onita  OBJECTIVE:  Note: Objective measures were completed at Evaluation unless otherwise noted.  DIAGNOSTIC FINDINGS:  EXAM: Magnetic resonance imaging, spinal canal and contents, lumbar, without contrast material. DATE: 01/03/2022 12:39 PM ACCESSION: [id number] UN DICTATED: 01/03/2022 12:53 PM INTERPRETATION LOCATION: 54.  CLINICAL INDICATION: 63 years old Male with right lumbar radiculopathy x 6 months ; Lumbar radiculopathy, symptoms persist with > 6 wks treatment  - M54.16 - Right lumbar radiculopathy - M54.9 - Back pain, unspecified back location, unspecified back pain laterality, unspecified chronicity    COMPARISON: None  TECHNIQUE: Multiplanar MRI was performed through the lumbar spine without intravenous contrast.  FINDINGS: Bone marrow signal intensity is normal. The visualized cord is unremarkable and the conus medullaris ends at a normal level.  The T12-L1 disc space is within normal limits.  The L1-L2 disc space is within normal limits.  The L2-L3 disc space demonstrates minimal desiccation of the disc as well as a diffuse bulging disc. There is degeneration of facet joints resulting in a borderline spinal canal diameter. The neuroforamina are widely patent.  These L3-L4 disc space shows desiccation of the disc and a diffuse disc bulge. The facet  joints are degenerated. There is no evidence of spinal canal diameter. There is moderate narrowing of the neuroforamina bilaterally and symmetrically.  The L4-L5 disc space demonstrates desiccation of the disc. There is a small posterior annular fissure. The disc is diffusely bulging is no evidence of spinal canal narrowing. The facet joints are degenerated bilaterally. The neural foramina are moderately to severely stenotic bilaterally and symmetrically.  The L5-S1 disc space demonstrates desiccation of the disc as well  as a diffuse disc bulge which is asymmetrical and predominantly to the left. This results in moderate narrowing of the left neuroforamen. The right-sided neuroforamen is normal. The diameter of the spinal canal is normal to. The paraspinal tissues are within normal limits.  For the purposes of this dictation, the lowest well formed intervertebral disc space is assumed to be the L5-S1 level, and there are presumed to be five lumbar-type vertebral bodies. Procedure Note  Thalia Males, MD - 01/03/2022 Formatting of this note might be different from the original. EXAM: Magnetic resonance imaging, spinal canal and contents, lumbar, without contrast material. DATE: 01/03/2022 12:39 PM ACCESSION: [id number] UN DICTATED: 01/03/2022 12:53 PM INTERPRETATION LOCATION: 54.  CLINICAL INDICATION: 63 years old Male with right lumbar radiculopathy x 6 months ; Lumbar radiculopathy, symptoms persist with > 6 wks treatment  - M54.16 - Right lumbar radiculopathy - M54.9 - Back pain, unspecified back location, unspecified back pain laterality, unspecified chronicity    COMPARISON: None  TECHNIQUE: Multiplanar MRI was performed through the lumbar spine without intravenous contrast.  FINDINGS: Bone marrow signal intensity is normal. The visualized cord is unremarkable and the conus medullaris ends at a normal level.  The T12-L1 disc space is within normal limits.  The L1-L2 disc space is  within normal limits.  The L2-L3 disc space demonstrates minimal desiccation of the disc as well as a diffuse bulging disc. There is degeneration of facet joints resulting in a borderline spinal canal diameter. The neuroforamina are widely patent.  These L3-L4 disc space shows desiccation of the disc and a diffuse disc bulge. The facet joints are degenerated. There is no evidence of spinal canal diameter. There is moderate narrowing of the neuroforamina bilaterally and symmetrically.  The L4-L5 disc space demonstrates desiccation of the disc. There is a small posterior annular fissure. The disc is diffusely bulging is no evidence of spinal canal narrowing. The facet joints are degenerated bilaterally. The neural foramina are moderately to severely stenotic bilaterally and symmetrically.  The L5-S1 disc space demonstrates desiccation of the disc as well as a diffuse disc bulge which is asymmetrical and predominantly to the left. This results in moderate narrowing of the left neuroforamen. The right-sided neuroforamen is normal. The diameter of the spinal canal is normal to. The paraspinal tissues are within normal limits.  For the purposes of this dictation, the lowest well formed intervertebral disc space is assumed to be the L5-S1 level, and there are presumed to be five lumbar-type vertebral bodies.  IMPRESSION: Mild degenerative changes no evidence of significant spinal canal stenosis. Exam End: 01/03/22 12:39   Specimen Collected: 01/03/22 12:53 Last Resulted: 01/03/22 12:57  Received From: [facility]  Result Received: 08/12/23 13:11    PATIENT SURVEYS:  Modified Oswestry:  MODIFIED OSWESTRY DISABILITY SCALE  Date: 04/27/2024 Score  Pain intensity 4 =  Pain medication provides me with little relief from pain.  2. Personal care (washing, dressing, etc.) 2 =  It is painful to take care of myself, and I am slow and careful.  3. Lifting 3 = Pain prevents me from lifting heavy weights,  but I can manage light to medium weights if they are conveniently positioned  4. Walking 3 =  Pain prevents me from walking more than  mile.  5. Sitting 2 =  Pain prevents me from sitting more than 1 hour.  6. Standing 3 =  Pain prevents me from standing more than 1/2 hour.  7. Sleeping 0 = Pain does not prevent  me from sleeping well.  8. Social Life 3 =  Pain prevents me from going out very often.  9. Traveling 3 = My pain restricts my travel over 1 hour  10. Employment/ Homemaking 2 = I can perform most of my homemaking/job duties, but pain prevents me from performing more physically stressful activities (eg, lifting, vacuuming).  Total 25/50 or 50%   Interpretation of scores: Score Category Description  0-20% Minimal Disability The patient can cope with most living activities. Usually no treatment is indicated apart from advice on lifting, sitting and exercise  21-40% Moderate Disability The patient experiences more pain and difficulty with sitting, lifting and standing. Travel and social life are more difficult and they may be disabled from work. Personal care, sexual activity and sleeping are not grossly affected, and the patient can usually be managed by conservative means  41-60% Severe Disability Pain remains the main problem in this group, but activities of daily living are affected. These patients require a detailed investigation  61-80% Crippled Back pain impinges on all aspects of the patient's life. Positive intervention is required  81-100% Bed-bound These patients are either bed-bound or exaggerating their symptoms  Bluford FORBES Zoe DELENA Karon DELENA, et al. Surgery versus conservative management of stable thoracolumbar fracture: the PRESTO feasibility RCT. [place] ([place]): [employer]; 2021 Nov. [facility] Technology Assessment, No. 25.62.) Appendix 3, Oswestry Disability Index category descriptors. Available from: Findjewelers.cz  Minimally  Clinically Important Difference (MCID) = 12.8%  COGNITION: Overall cognitive status: Within functional limits for tasks assessed     SENSATION: Light touch: Impaired   MUSCLE LENGTH: Hamstrings: Right 70 deg; Left 82 deg   POSTURE: increased lumbar lordosis  PALPATION: (+) edema R low back just superior to SI region- (+) for pain  LUMBAR ROM:   AROM eval  Flexion *Fingertips to ankle  Extension *WFL  Right lateral flexion *Fingertips to lateral fibular head  Left lateral flexion *Fingertips to lateral knee joint  Right rotation *Full yet painful  Left rotation *Full yet painful   (Blank rows = not tested) (*= painful ROM)  LOWER EXTREMITY ROM:     Active  Right eval Left eval  Hip flexion    Hip extension    Hip abduction    Hip adduction    Hip internal rotation    Hip external rotation    Knee flexion    Knee extension    Ankle dorsiflexion    Ankle plantarflexion    Ankle inversion    Ankle eversion     (Blank rows = not tested)  LOWER EXTREMITY MMT:    MMT Right eval Left eval  Hip flexion 3+ 4  Hip extension    Hip abduction 3+ 4  Hip adduction 3+ 4  Hip internal rotation 3+ 4  Hip external rotation 3+ 4  Knee flexion 4 4  Knee extension 4 4  Ankle dorsiflexion 4+ 5  Ankle plantarflexion    Ankle inversion    Ankle eversion     (Blank rows = not tested)  LUMBAR SPECIAL TESTS:  Straight leg raise test: Negative, Slump test: Positive, and SI Compression/distraction test: Negative  FUNCTIONAL TESTS:  5 times sit to stand: 24.3 sec with min UE support  GAIT: Distance walked: 50 feet Assistive device utilized: None Level of assistance: Complete Independence Comments: Antalgic with decreased step length and poor heel to toe  TREATMENT DATE: 04/27/2024  *Moist heat placed under pt R lower back during stretching exercises*  supine lower trunk rotation, 20x3s hold each side supine piriformis stretch 3x30'' each.  supine bridges 2x10,  reports kinda hard Physio-ball DKC 2x10 with 5'' holds.  supine marches 2x10ea  Side-lying clamshells RTB 2x10 each. Abdominal isometric with physioball 2x10 with 5'' holds supine SLR 2x10 each. Seated physio-ball rollouts 3 directions 5x each direction with 5'' holds.   PATIENT EDUCATION:  Education details: Pt educated on exercise mechanics/positioning today for improved abdominal contraction/isolation. Person educated: Patient Education method: Explanation Education comprehension: verbalized understanding  HOME EXERCISE PROGRAM: Access Code: [email or web address] URL: [email or web address] Date: 04/30/2024 Prepared by: Reyes London  Exercises - Supine Single Knee to Chest Stretch  - 1 x daily - 3 sets - 30 hold - Supine Lower Trunk Rotation  - 1 x daily - 3 sets - 30 sec hold - Supine Piriformis Stretch with Foot on Ground  - 1 x daily - 3 sets - 30 sec hold - Supine Bridge  - 1 x daily - 3 sets - 10 reps - Seated Lumbar Flexion Stretch  - 1 x daily - 3 sets - 30 hold - Seated Figure 4 Piriformis Stretch  - 1 x daily - 3 sets - 30 hold - Seated Piriformis Stretch  - 1 x daily - 3 sets - 30 hold - Supine Figure 4 Piriformis Stretch  - 1 x daily - 3 sets - 30 hold   ASSESSMENT:  CLINICAL IMPRESSION:  Pt began today's session with LTR in hooklying for improved lumbar mobility which he reports felt pretty good. Lower abdominal strengthening/stability exercises performed in supine position including bridges, DKC with physioball, and abd. Isometric with physioball. Side-lying clamshells performed to improve pelvic stability. Seated physio-ball rollouts performed at end of treatment for improved lumbar mobility. Patient responded well to verbal and tactile cues for positioning/mechanics. He was noted to continue to have decreased abdominal strength and poor trunk control when instructing PPT requiring increased cuing for proper mechanics/muscle activation. He reported that he  felt today's session was really productive and he felt fatigued following today's session. Pt will continue to benefit from skilled therapy to address remaining deficits in order to improve overall QoL and return to PLOF.   OBJECTIVE IMPAIRMENTS: Abnormal gait, decreased activity tolerance, decreased balance, decreased coordination, decreased endurance, decreased knowledge of use of DME, decreased mobility, difficulty walking, decreased ROM, decreased strength, decreased safety awareness, impaired perceived functional ability, impaired flexibility, impaired sensation, postural dysfunction, obesity, and pain.   ACTIVITY LIMITATIONS: carrying, lifting, bending, sitting, standing, squatting, stairs, transfers, bed mobility, and dressing  PARTICIPATION LIMITATIONS: meal prep, cleaning, laundry, shopping, community activity, and yard work  PERSONAL FACTORS: 3+ comorbidities: DM, Neuropathy, Crohn's, HTN, SAH are also affecting patient's functional outcome.   REHAB POTENTIAL: Good  CLINICAL DECISION MAKING: Evolving/moderate complexity  EVALUATION COMPLEXITY: Moderate   GOALS: Goals reviewed with patient? Yes  SHORT TERM GOALS: Target date: 06/04/2024  Pt will be independent with HEP in order to improve strength and decrease back pain in order to improve pain-free function at home and work. Baseline: 04/27/2024= no formal HEP in place Goal status: INITIAL     LONG TERM GOALS: Target date: 07/20/2024  Pt will decrease mODI score by at least 13 points in order demonstrate clinically significant reduction in back pain/disability.  Baseline: 04/27/2024 Goal status: INITIAL  2.  Pt will decrease worst back pain as reported on NPRS by at least 2 points in order to demonstrate clinically significant reduction in back pain.  Baseline:  Goal status: INITIAL  3.  Pt will increase strength of by at least 1/2 MMT grade in order to demonstrate improvement in strength and function. Baseline:  04/27/2024= See above chart Goal status: INITIAL  4.  Patient will report ability to walk > 1/4 mile without reset and < 7/10 low back pain for  improved community distances.  Baseline: 04/27/2024= Pain prevents from walking 1/4 mi Goal status: INITIAL  PLAN:  PT FREQUENCY: 1-2x/week  PT DURATION: 12 weeks  PLANNED INTERVENTIONS: 97164- PT Re-evaluation, 97750- Physical Performance Testing, 97110-Therapeutic exercises, 97530- Therapeutic activity, [id number]- Neuromuscular re-education, 97535- Self Care, [id number]- Manual therapy, [id number]- Gait training, [phone]- Orthotic Initial, [phone]- Orthotic/Prosthetic subsequent, [phone]- Canalith repositioning, [phone]- Electrical stimulation (manual), [id number]- Ultrasound, 79439 (1-2 muscles), [id number] (3+ muscles)- Dry Needling, Patient/Family education, Balance training, Stair training, Taping, Joint mobilization, Joint manipulation, Spinal manipulation, Spinal mobilization, Vestibular training, DME instructions, Cryotherapy, and Moist heat.  PLAN FOR NEXT SESSION:  Continue manual therapy for low back pain/ROM Progress HEP  Progress core stabilization activities   Chyrl London, PT Physical Therapist  [facility] [phone] 05/24/2024, 10:10 PM

## 2024-05-25 ENCOUNTER — Telehealth: Payer: Self-pay

## 2024-05-25 ENCOUNTER — Ambulatory Visit

## 2024-05-25 DIAGNOSIS — R262 Difficulty in walking, not elsewhere classified: Secondary | ICD-10-CM | POA: Insufficient documentation

## 2024-05-25 DIAGNOSIS — M5441 Lumbago with sciatica, right side: Secondary | ICD-10-CM | POA: Insufficient documentation

## 2024-05-25 DIAGNOSIS — G8929 Other chronic pain: Secondary | ICD-10-CM | POA: Insufficient documentation

## 2024-05-25 DIAGNOSIS — M6281 Muscle weakness (generalized): Secondary | ICD-10-CM | POA: Insufficient documentation

## 2024-05-25 NOTE — Telephone Encounter (Signed)
 Patient Name: Jeffrey Solomon MRN: [id number] DOB:01-May-1961, 63 y.o., male Today's Date: 05/25/2024  Pt contacted via telephone and dino spoke with patient and informed him of missed appointment today and informed pt of future PT appointment date and time. He expressed that he had written down the wrong times in his calendar. Informed of scheduled time of 11 am on Wed and he confirmed understanding of appt time stating he will be there.      Reyes LOISE London, PT 05/25/2024, 11:52 AM

## 2024-05-27 ENCOUNTER — Ambulatory Visit

## 2024-05-27 DIAGNOSIS — M6281 Muscle weakness (generalized): Secondary | ICD-10-CM | POA: Diagnosis present

## 2024-05-27 DIAGNOSIS — R262 Difficulty in walking, not elsewhere classified: Secondary | ICD-10-CM

## 2024-05-27 DIAGNOSIS — M5441 Lumbago with sciatica, right side: Secondary | ICD-10-CM | POA: Diagnosis present

## 2024-05-27 DIAGNOSIS — G8929 Other chronic pain: Secondary | ICD-10-CM | POA: Diagnosis present

## 2024-05-27 NOTE — Therapy (Signed)
 OUTPATIENT PHYSICAL THERAPY THORACOLUMBAR TREATMENT   Patient Name: Jeffrey Solomon MRN: [id number] DOB:Oct 20, 1960, 63 y.o., male Today's Date: 05/28/2024  END OF SESSION:  PT End of Session - 05/27/24 1110     Visit Number 6    Number of Visits 24    Date for Recertification  07/20/24    Progress Note Due on Visit 10    PT Start Time 1102    PT Stop Time 1143    PT Time Calculation (min) 41 min    Activity Tolerance Patient tolerated treatment well;No increased pain    Behavior During Therapy WFL for tasks assessed/performed             Past Medical History:  Diagnosis Date   Diabetes mellitus without complication (HCC)    Hypertension    History reviewed. No pertinent surgical history. Patient Active Problem List   Diagnosis Date Noted   Foot callus 11/19/2023   Chronic heart failure with preserved ejection fraction (HFpEF) (HCC) 09/23/2023   History of ischemic stroke 09/16/2023   History of malignant neoplasm of thyroid 09/16/2023   Hypoxia 09/16/2023   Ulcerative colitis (HCC) 02/21/2022   Type 2 diabetes with complication (HCC) 06/22/2021   Thyroid cancer (HCC) 05/25/2021   Vitamin D  deficiency 09/29/2020   Hyperlipidemia 02/26/2020   Iron deficiency anemia 02/26/2020   Obesity 02/26/2020   Hyperparathyroidism 09/28/2019   OSA (obstructive sleep apnea) 09/16/2019   SAH (subarachnoid hemorrhage) (HCC) 12/30/2015   Degeneration of lumbar intervertebral disc 08/06/2011   Hypertension, benign 06/28/2010    PCP: Solomon, Susan, MD  REFERRING PROVIDER: Ziglar, Susan, MD  REFERRING DIAG: [phone] (ICD-10-CM) - Chronic right-sided low back pain with right-sided sciatica   Rationale for Evaluation and Treatment: Rehabilitation  THERAPY DIAG:  Chronic right-sided low back pain with right-sided sciatica  Muscle weakness (generalized)  Difficulty in walking, not elsewhere classified  ONSET DATE: Since age of 37 per patient report  SUBJECTIVE:                                                                                                                                                                                            SUBJECTIVE STATEMENT:   From Today: Patient reports he thinks a lot of his problem is significant weight gain. Reports back is okay today- around 6/10 pain mainly on R side of low back. Denies any pain down the legs.   From EVAL:  Patient reports having chronic low back pain for many years yet progressive worse lately. States intermittent R LE sciatica- not consistent with radicular symptoms but does report constant toothache pain in right side low  back. States unsure if anything will help and meds are not helping. Hasn't really tried heat but not a fan of ice. Reports difficulty with everyday life but still able to do all ADL's but with pain. States does not work and goal is to get some relief.   PERTINENT HISTORY:  History taken from medical chart- office visit from Dr. Ziglar on 10/27 History of Present Illness   Jeffrey Solomon is a 63 year old male who presents with persistent back pain and numbness in the right leg.   He has persistent back pain that has not changed over time. He is taking gabapentin  but is unsure of its effectiveness, as he does not notice any difference in his symptoms. He is confused about his medication, noting an instance where he took two pills without realizing they were the same. This was cyclobenzaprine  5mg .    He reports that he has a history of a slipped disc between the fourth and fifth lumbar vertebrae, which he believes contributes to his back pain. He has not engaged in physical therapy recently, although he recalls participating in therapy several years ago for an unspecified issue.   He experiences numbness and pain radiating down his right leg once or twice a month. He describes episodes where he feels like he might fall when stepping, although he has not fallen yet. He has  not noticed significant improvement in the numbness and pain in his right leg.   He recalls a previous attempt to schedule an MRI, which was canceled due to insurance issues. He has not rescheduled it yet. He mentions a past CT scan of his abdomen and pelvis ordered by a gastroenterologist, Dr. Lamar Solomon, which involved drinking a liquid before the scan.  PAIN:  From today: beginning session 9/10, following heating pad and stretches 7/10, end of session 6/10  Are you having pain? Yes: NPRS scale: current 7/10; best= 4/10; worst = 9/10 Pain location: Right side low back  Pain description: Sharp, toothache  Aggravating factors: prolonged walking Relieving factors: nothing  PRECAUTIONS: None  RED FLAGS: None   WEIGHT BEARING RESTRICTIONS: No  FALLS:  Has patient fallen in last 6 months? No  LIVING ENVIRONMENT: Lives with: lives with an adult companion Lives in: House/apartment Stairs: Yes: External: 2.5 steps; on left going up Has following equipment at home: [name] - 2 wheeled  OCCUPATION: Disabled   PLOF: Mother assist with meals. Still driving  PATIENT GOALS: Decrease swelling   NEXT MD VISIT: Jul 21, 2024 with Dr. Onita  OBJECTIVE:  Note: Objective measures were completed at Evaluation unless otherwise noted.  DIAGNOSTIC FINDINGS:  EXAM: Magnetic resonance imaging, spinal canal and contents, lumbar, without contrast material. DATE: 01/03/2022 12:39 PM ACCESSION: [id number] UN DICTATED: 01/03/2022 12:53 PM INTERPRETATION LOCATION: 54.  CLINICAL INDICATION: 63 years old Male with right lumbar radiculopathy x 6 months ; Lumbar radiculopathy, symptoms persist with > 6 wks treatment  - M54.16 - Right lumbar radiculopathy - M54.9 - Back pain, unspecified back location, unspecified back pain laterality, unspecified chronicity    COMPARISON: None  TECHNIQUE: Multiplanar MRI was performed through the lumbar spine without intravenous contrast.  FINDINGS: Bone marrow  signal intensity is normal. The visualized cord is unremarkable and the conus medullaris ends at a normal level.  The T12-L1 disc space is within normal limits.  The L1-L2 disc space is within normal limits.  The L2-L3 disc space demonstrates minimal desiccation of the disc as well as a diffuse bulging disc. There  is degeneration of facet joints resulting in a borderline spinal canal diameter. The neuroforamina are widely patent.  These L3-L4 disc space shows desiccation of the disc and a diffuse disc bulge. The facet joints are degenerated. There is no evidence of spinal canal diameter. There is moderate narrowing of the neuroforamina bilaterally and symmetrically.  The L4-L5 disc space demonstrates desiccation of the disc. There is a small posterior annular fissure. The disc is diffusely bulging is no evidence of spinal canal narrowing. The facet joints are degenerated bilaterally. The neural foramina are moderately to severely stenotic bilaterally and symmetrically.  The L5-S1 disc space demonstrates desiccation of the disc as well as a diffuse disc bulge which is asymmetrical and predominantly to the left. This results in moderate narrowing of the left neuroforamen. The right-sided neuroforamen is normal. The diameter of the spinal canal is normal to. The paraspinal tissues are within normal limits.  For the purposes of this dictation, the lowest well formed intervertebral disc space is assumed to be the L5-S1 level, and there are presumed to be five lumbar-type vertebral bodies. Procedure Note  Thalia Males, MD - 01/03/2022 Formatting of this note might be different from the original. EXAM: Magnetic resonance imaging, spinal canal and contents, lumbar, without contrast material. DATE: 01/03/2022 12:39 PM ACCESSION: [id number] UN DICTATED: 01/03/2022 12:53 PM INTERPRETATION LOCATION: 54.  CLINICAL INDICATION: 63 years old Male with right lumbar radiculopathy x 6 months ; Lumbar  radiculopathy, symptoms persist with > 6 wks treatment  - M54.16 - Right lumbar radiculopathy - M54.9 - Back pain, unspecified back location, unspecified back pain laterality, unspecified chronicity    COMPARISON: None  TECHNIQUE: Multiplanar MRI was performed through the lumbar spine without intravenous contrast.  FINDINGS: Bone marrow signal intensity is normal. The visualized cord is unremarkable and the conus medullaris ends at a normal level.  The T12-L1 disc space is within normal limits.  The L1-L2 disc space is within normal limits.  The L2-L3 disc space demonstrates minimal desiccation of the disc as well as a diffuse bulging disc. There is degeneration of facet joints resulting in a borderline spinal canal diameter. The neuroforamina are widely patent.  These L3-L4 disc space shows desiccation of the disc and a diffuse disc bulge. The facet joints are degenerated. There is no evidence of spinal canal diameter. There is moderate narrowing of the neuroforamina bilaterally and symmetrically.  The L4-L5 disc space demonstrates desiccation of the disc. There is a small posterior annular fissure. The disc is diffusely bulging is no evidence of spinal canal narrowing. The facet joints are degenerated bilaterally. The neural foramina are moderately to severely stenotic bilaterally and symmetrically.  The L5-S1 disc space demonstrates desiccation of the disc as well as a diffuse disc bulge which is asymmetrical and predominantly to the left. This results in moderate narrowing of the left neuroforamen. The right-sided neuroforamen is normal. The diameter of the spinal canal is normal to. The paraspinal tissues are within normal limits.  For the purposes of this dictation, the lowest well formed intervertebral disc space is assumed to be the L5-S1 level, and there are presumed to be five lumbar-type vertebral bodies.  IMPRESSION: Mild degenerative changes no evidence of significant spinal  canal stenosis. Exam End: 01/03/22 12:39   Specimen Collected: 01/03/22 12:53 Last Resulted: 01/03/22 12:57  Received From: [facility]  Result Received: 08/12/23 13:11    PATIENT SURVEYS:  Modified Oswestry:  MODIFIED OSWESTRY DISABILITY SCALE  Date: 04/27/2024 Score  Pain intensity 4 =  Pain medication provides me with little relief from pain.  2. Personal care (washing, dressing, etc.) 2 =  It is painful to take care of myself, and I am slow and careful.  3. Lifting 3 = Pain prevents me from lifting heavy weights, but I can manage light to medium weights if they are conveniently positioned  4. Walking 3 =  Pain prevents me from walking more than  mile.  5. Sitting 2 =  Pain prevents me from sitting more than 1 hour.  6. Standing 3 =  Pain prevents me from standing more than 1/2 hour.  7. Sleeping 0 = Pain does not prevent me from sleeping well.  8. Social Life 3 =  Pain prevents me from going out very often.  9. Traveling 3 = My pain restricts my travel over 1 hour  10. Employment/ Homemaking 2 = I can perform most of my homemaking/job duties, but pain prevents me from performing more physically stressful activities (eg, lifting, vacuuming).  Total 25/50 or 50%   Interpretation of scores: Score Category Description  0-20% Minimal Disability The patient can cope with most living activities. Usually no treatment is indicated apart from advice on lifting, sitting and exercise  21-40% Moderate Disability The patient experiences more pain and difficulty with sitting, lifting and standing. Travel and social life are more difficult and they may be disabled from work. Personal care, sexual activity and sleeping are not grossly affected, and the patient can usually be managed by conservative means  41-60% Severe Disability Pain remains the main problem in this group, but activities of daily living are affected. These patients require a detailed investigation  61-80% Crippled Back pain  impinges on all aspects of the patient's life. Positive intervention is required  81-100% Bed-bound These patients are either bed-bound or exaggerating their symptoms  Bluford FORBES Zoe DELENA Karon DELENA, et al. Surgery versus conservative management of stable thoracolumbar fracture: the PRESTO feasibility RCT. [place] ([place]): [employer]; 2021 Nov. [facility] Technology Assessment, No. 25.62.) Appendix 3, Oswestry Disability Index category descriptors. Available from: Findjewelers.cz  Minimally Clinically Important Difference (MCID) = 12.8%  COGNITION: Overall cognitive status: Within functional limits for tasks assessed     SENSATION: Light touch: Impaired   MUSCLE LENGTH: Hamstrings: Right 70 deg; Left 82 deg   POSTURE: increased lumbar lordosis  PALPATION: (+) edema R low back just superior to SI region- (+) for pain  LUMBAR ROM:   AROM eval  Flexion *Fingertips to ankle  Extension *WFL  Right lateral flexion *Fingertips to lateral fibular head  Left lateral flexion *Fingertips to lateral knee joint  Right rotation *Full yet painful  Left rotation *Full yet painful   (Blank rows = not tested) (*= painful ROM)  LOWER EXTREMITY ROM:     Active  Right eval Left eval  Hip flexion    Hip extension    Hip abduction    Hip adduction    Hip internal rotation    Hip external rotation    Knee flexion    Knee extension    Ankle dorsiflexion    Ankle plantarflexion    Ankle inversion    Ankle eversion     (Blank rows = not tested)  LOWER EXTREMITY MMT:    MMT Right eval Left eval  Hip flexion 3+ 4  Hip extension    Hip abduction 3+ 4  Hip adduction 3+ 4  Hip internal rotation 3+ 4  Hip external rotation 3+ 4  Knee  flexion 4 4  Knee extension 4 4  Ankle dorsiflexion 4+ 5  Ankle plantarflexion    Ankle inversion    Ankle eversion     (Blank rows = not tested)  LUMBAR SPECIAL TESTS:  Straight leg raise test: Negative,  Slump test: Positive, and SI Compression/distraction test: Negative  FUNCTIONAL TESTS:  5 times sit to stand: 24.3 sec with min UE support  GAIT: Distance walked: 50 feet Assistive device utilized: None Level of assistance: Complete Independence Comments: Antalgic with decreased step length and poor heel to toe  TREATMENT DATE: 05/27/2024  Nustep - interval training to promote LE strength/cardioresp endurance. PT sets up intervention, adjusts intensity throughout and monitors pt for response. Cuing for SPM/speed, pt maintains SPM in 50s-60's. Total Time = 8 min LE only.   Extensive education in [place] regarding options: group sessions, massage chair options, dumbells/free weights and gym equipment including:  TM- level - 0.8 mph- x 8 min BUE (Close CGA as patient had never used TM- very detailed explanation- demo of use and use of safety clip)   Octane- L3-6 x 8 min - VC for fwd or bwd propulsion.   Brief introduction to gym equipment including some upper and lower body options.- Discussed at length but not actually performed due to time.  PATIENT EDUCATION:  Education details: Pt educated on exercise mechanics/positioning today for improved abdominal contraction/isolation. Person educated: Patient Education method: Explanation Education comprehension: verbalized understanding  HOME EXERCISE PROGRAM: Access Code: [email or web address] URL: [email or web address] Date: 04/30/2024 Prepared by: Reyes London  Exercises - Supine Single Knee to Chest Stretch  - 1 x daily - 3 sets - 30 hold - Supine Lower Trunk Rotation  - 1 x daily - 3 sets - 30 sec hold - Supine Piriformis Stretch with Foot on Ground  - 1 x daily - 3 sets - 30 sec hold - Supine Bridge  - 1 x daily - 3 sets - 10 reps - Seated Lumbar Flexion Stretch  - 1 x daily - 3 sets - 30 hold - Seated Figure 4 Piriformis Stretch  - 1 x daily - 3 sets - 30 hold - Seated Piriformis Stretch  - 1 x daily - 3 sets - 30  hold - Supine Figure 4 Piriformis Stretch  - 1 x daily - 3 sets - 30 hold   ASSESSMENT:  CLINICAL IMPRESSION:  Treatment focused on providing patient some weight loss options to help with his back pain and functional mobility. He is thinking about joining a gym but very unfamiliar with cardiac/mobility devices. Spent majority of session explaining safety and having patient try multiple equipment today. He responded well with no increased in pain. Very challenged on TM and will benefit from review. Pt will continue to benefit from skilled therapy to address remaining deficits in order to improve overall QoL and return to PLOF.   OBJECTIVE IMPAIRMENTS: Abnormal gait, decreased activity tolerance, decreased balance, decreased coordination, decreased endurance, decreased knowledge of use of DME, decreased mobility, difficulty walking, decreased ROM, decreased strength, decreased safety awareness, impaired perceived functional ability, impaired flexibility, impaired sensation, postural dysfunction, obesity, and pain.   ACTIVITY LIMITATIONS: carrying, lifting, bending, sitting, standing, squatting, stairs, transfers, bed mobility, and dressing  PARTICIPATION LIMITATIONS: meal prep, cleaning, laundry, shopping, community activity, and yard work  PERSONAL FACTORS: 3+ comorbidities: DM, Neuropathy, Crohn's, HTN, SAH are also affecting patient's functional outcome.   REHAB POTENTIAL: Good  CLINICAL DECISION MAKING: Evolving/moderate complexity  EVALUATION COMPLEXITY: Moderate   GOALS: Goals reviewed  with patient? Yes  SHORT TERM GOALS: Target date: 06/04/2024  Pt will be independent with HEP in order to improve strength and decrease back pain in order to improve pain-free function at home and work. Baseline: 04/27/2024= no formal HEP in place Goal status: INITIAL     LONG TERM GOALS: Target date: 07/20/2024  Pt will decrease mODI score by at least 13 points in order demonstrate clinically  significant reduction in back pain/disability.  Baseline: 04/27/2024 Goal status: INITIAL  2.  Pt will decrease worst back pain as reported on NPRS by at least 2 points in order to demonstrate clinically significant reduction in back pain.  Baseline:  Goal status: INITIAL  3.  Pt will increase strength of by at least 1/2 MMT grade in order to demonstrate improvement in strength and function. Baseline: 04/27/2024= See above chart Goal status: INITIAL  4.  Patient will report ability to walk > 1/4 mile without reset and < 7/10 low back pain for  improved community distances.  Baseline: 04/27/2024= Pain prevents from walking 1/4 mi Goal status: INITIAL  PLAN:  PT FREQUENCY: 1-2x/week  PT DURATION: 12 weeks  PLANNED INTERVENTIONS: [id number]- PT Re-evaluation, 97750- Physical Performance Testing, 97110-Therapeutic exercises, 97530- Therapeutic activity, [id number]- Neuromuscular re-education, 97535- Self Care, [id number]- Manual therapy, [id number]- Gait training, [phone]- Orthotic Initial, [phone]- Orthotic/Prosthetic subsequent, [phone]- Canalith repositioning, [phone]- Electrical stimulation (manual), [id number]- Ultrasound, 79439 (1-2 muscles), [id number] (3+ muscles)- Dry Needling, Patient/Family education, Balance training, Stair training, Taping, Joint mobilization, Joint manipulation, Spinal manipulation, Spinal mobilization, Vestibular training, DME instructions, Cryotherapy, and Moist heat.  PLAN FOR NEXT SESSION:  Continue manual therapy for low back pain/ROM Progress HEP  Progress core stabilization activities   Chyrl London, PT Physical Therapist  [facility] [phone] 05/28/2024, 9:03 AM

## 2024-06-01 ENCOUNTER — Ambulatory Visit

## 2024-06-01 NOTE — Therapy (Incomplete)
 OUTPATIENT PHYSICAL THERAPY THORACOLUMBAR TREATMENT   Patient Name: Jeffrey Solomon MRN: [id number] DOB:06/30/1960, 63 y.o., male Today's Date: 06/01/2024  END OF SESSION:       Past Medical History:  Diagnosis Date   Diabetes mellitus without complication (HCC)    Hypertension    No past surgical history on file. Patient Active Problem List   Diagnosis Date Noted   Foot callus 11/19/2023   Chronic heart failure with preserved ejection fraction (HFpEF) (HCC) 09/23/2023   History of ischemic stroke 09/16/2023   History of malignant neoplasm of thyroid 09/16/2023   Hypoxia 09/16/2023   Ulcerative colitis (HCC) 02/21/2022   Type 2 diabetes with complication (HCC) 06/22/2021   Thyroid cancer (HCC) 05/25/2021   Vitamin D  deficiency 09/29/2020   Hyperlipidemia 02/26/2020   Iron deficiency anemia 02/26/2020   Obesity 02/26/2020   Hyperparathyroidism 09/28/2019   OSA (obstructive sleep apnea) 09/16/2019   SAH (subarachnoid hemorrhage) (HCC) 12/30/2015   Degeneration of lumbar intervertebral disc 08/06/2011   Hypertension, benign 06/28/2010    PCP: Solomon, Susan, Solomon  REFERRING PROVIDER: Ziglar, Susan, Solomon  REFERRING DIAG: [phone] (ICD-10-CM) - Chronic right-sided low back pain with right-sided sciatica   Rationale for Evaluation and Treatment: Rehabilitation  THERAPY DIAG:  No diagnosis found.  ONSET DATE: Since age of 43 per patient report  SUBJECTIVE:                                                                                                                                                                                           SUBJECTIVE STATEMENT:   From Today: ***  From EVAL:  Patient reports having chronic low back pain for many years yet progressive worse lately. States intermittent R LE sciatica- not consistent with radicular symptoms but does report constant toothache pain in right side low back. States unsure if anything will help and meds  are not helping. Hasn't really tried heat but not a fan of ice. Reports difficulty with everyday life but still able to do all ADL's but with pain. States does not work and goal is to get some relief.   PERTINENT HISTORY:  History taken from medical chart- office visit from Jeffrey Solomon on 10/27 History of Present Illness   Jeffrey Solomon is a 63 year old male who presents with persistent back pain and numbness in the right leg.   He has persistent back pain that has not changed over time. He is taking gabapentin  but is unsure of its effectiveness, as he does not notice any difference in his symptoms. He is confused about his medication, noting an instance where he took two pills  without realizing they were the same. This was cyclobenzaprine  5mg .    He reports that he has a history of a slipped disc between the fourth and fifth lumbar vertebrae, which he believes contributes to his back pain. He has not engaged in physical therapy recently, although he recalls participating in therapy several years ago for an unspecified issue.   He experiences numbness and pain radiating down his right leg once or twice a month. He describes episodes where he feels like he might fall when stepping, although he has not fallen yet. He has not noticed significant improvement in the numbness and pain in his right leg.   He recalls a previous attempt to schedule an MRI, which was canceled due to insurance issues. He has not rescheduled it yet. He mentions a past CT scan of his abdomen and pelvis ordered by a gastroenterologist, Jeffrey Solomon, which involved drinking a liquid before the scan.  PAIN:  From today: beginning session 9/10, following heating pad and stretches 7/10, end of session 6/10  Are you having pain? Yes: NPRS scale: current 7/10; best= 4/10; worst = 9/10 Pain location: Right side low back  Pain description: Sharp, toothache  Aggravating factors: prolonged walking Relieving factors:  nothing  PRECAUTIONS: None  RED FLAGS: None   WEIGHT BEARING RESTRICTIONS: No  FALLS:  Has patient fallen in last 6 months? No  LIVING ENVIRONMENT: Lives with: lives with an adult companion Lives in: House/apartment Stairs: Yes: External: 2.5 steps; on left going up Has following equipment at home: [name] - 2 wheeled  OCCUPATION: Disabled   PLOF: Mother assist with meals. Still driving  PATIENT GOALS: Decrease swelling   NEXT Solomon VISIT: Jul 21, 2024 with Dr. Onita  OBJECTIVE:  Note: Objective measures were completed at Evaluation unless otherwise noted.  DIAGNOSTIC FINDINGS:  EXAM: Magnetic resonance imaging, spinal canal and contents, lumbar, without contrast material. DATE: 01/03/2022 12:39 PM ACCESSION: [id number] UN DICTATED: 01/03/2022 12:53 PM INTERPRETATION LOCATION: 54.  CLINICAL INDICATION: 63 years old Male with right lumbar radiculopathy x 6 months ; Lumbar radiculopathy, symptoms persist with > 6 wks treatment  - M54.16 - Right lumbar radiculopathy - M54.9 - Back pain, unspecified back location, unspecified back pain laterality, unspecified chronicity    COMPARISON: None  TECHNIQUE: Multiplanar MRI was performed through the lumbar spine without intravenous contrast.  FINDINGS: Bone marrow signal intensity is normal. The visualized cord is unremarkable and the conus medullaris ends at a normal level.  The T12-L1 disc space is within normal limits.  The L1-L2 disc space is within normal limits.  The L2-L3 disc space demonstrates minimal desiccation of the disc as well as a diffuse bulging disc. There is degeneration of facet joints resulting in a borderline spinal canal diameter. The neuroforamina are widely patent.  These L3-L4 disc space shows desiccation of the disc and a diffuse disc bulge. The facet joints are degenerated. There is no evidence of spinal canal diameter. There is moderate narrowing of the neuroforamina bilaterally and  symmetrically.  The L4-L5 disc space demonstrates desiccation of the disc. There is a small posterior annular fissure. The disc is diffusely bulging is no evidence of spinal canal narrowing. The facet joints are degenerated bilaterally. The neural foramina are moderately to severely stenotic bilaterally and symmetrically.  The L5-S1 disc space demonstrates desiccation of the disc as well as a diffuse disc bulge which is asymmetrical and predominantly to the left. This results in moderate narrowing of the left neuroforamen. The right-sided neuroforamen is  normal. The diameter of the spinal canal is normal to. The paraspinal tissues are within normal limits.  For the purposes of this dictation, the lowest well formed intervertebral disc space is assumed to be the L5-S1 level, and there are presumed to be five lumbar-type vertebral bodies. Procedure Note  Thalia Males, Solomon - 01/03/2022 Formatting of this note might be different from the original. EXAM: Magnetic resonance imaging, spinal canal and contents, lumbar, without contrast material. DATE: 01/03/2022 12:39 PM ACCESSION: [id number] UN DICTATED: 01/03/2022 12:53 PM INTERPRETATION LOCATION: 54.  CLINICAL INDICATION: 63 years old Male with right lumbar radiculopathy x 6 months ; Lumbar radiculopathy, symptoms persist with > 6 wks treatment  - M54.16 - Right lumbar radiculopathy - M54.9 - Back pain, unspecified back location, unspecified back pain laterality, unspecified chronicity    COMPARISON: None  TECHNIQUE: Multiplanar MRI was performed through the lumbar spine without intravenous contrast.  FINDINGS: Bone marrow signal intensity is normal. The visualized cord is unremarkable and the conus medullaris ends at a normal level.  The T12-L1 disc space is within normal limits.  The L1-L2 disc space is within normal limits.  The L2-L3 disc space demonstrates minimal desiccation of the disc as well as a diffuse bulging disc. There is  degeneration of facet joints resulting in a borderline spinal canal diameter. The neuroforamina are widely patent.  These L3-L4 disc space shows desiccation of the disc and a diffuse disc bulge. The facet joints are degenerated. There is no evidence of spinal canal diameter. There is moderate narrowing of the neuroforamina bilaterally and symmetrically.  The L4-L5 disc space demonstrates desiccation of the disc. There is a small posterior annular fissure. The disc is diffusely bulging is no evidence of spinal canal narrowing. The facet joints are degenerated bilaterally. The neural foramina are moderately to severely stenotic bilaterally and symmetrically.  The L5-S1 disc space demonstrates desiccation of the disc as well as a diffuse disc bulge which is asymmetrical and predominantly to the left. This results in moderate narrowing of the left neuroforamen. The right-sided neuroforamen is normal. The diameter of the spinal canal is normal to. The paraspinal tissues are within normal limits.  For the purposes of this dictation, the lowest well formed intervertebral disc space is assumed to be the L5-S1 level, and there are presumed to be five lumbar-type vertebral bodies.  IMPRESSION: Mild degenerative changes no evidence of significant spinal canal stenosis. Exam End: 01/03/22 12:39   Specimen Collected: 01/03/22 12:53 Last Resulted: 01/03/22 12:57  Received From: [facility]  Result Received: 08/12/23 13:11    PATIENT SURVEYS:  Modified Oswestry:  MODIFIED OSWESTRY DISABILITY SCALE  Date: 04/27/2024 Score  Pain intensity 4 =  Pain medication provides me with little relief from pain.  2. Personal care (washing, dressing, etc.) 2 =  It is painful to take care of myself, and I am slow and careful.  3. Lifting 3 = Pain prevents me from lifting heavy weights, but I can manage light to medium weights if they are conveniently positioned  4. Walking 3 =  Pain prevents me from walking more than   mile.  5. Sitting 2 =  Pain prevents me from sitting more than 1 hour.  6. Standing 3 =  Pain prevents me from standing more than 1/2 hour.  7. Sleeping 0 = Pain does not prevent me from sleeping well.  8. Social Life 3 =  Pain prevents me from going out very often.  9. Traveling 3 = My pain  restricts my travel over 1 hour  10. Employment/ Homemaking 2 = I can perform most of my homemaking/job duties, but pain prevents me from performing more physically stressful activities (eg, lifting, vacuuming).  Total 25/50 or 50%   Interpretation of scores: Score Category Description  0-20% Minimal Disability The patient can cope with most living activities. Usually no treatment is indicated apart from advice on lifting, sitting and exercise  21-40% Moderate Disability The patient experiences more pain and difficulty with sitting, lifting and standing. Travel and social life are more difficult and they may be disabled from work. Personal care, sexual activity and sleeping are not grossly affected, and the patient can usually be managed by conservative means  41-60% Severe Disability Pain remains the main problem in this group, but activities of daily living are affected. These patients require a detailed investigation  61-80% Crippled Back pain impinges on all aspects of the patient's life. Positive intervention is required  81-100% Bed-bound These patients are either bed-bound or exaggerating their symptoms  Bluford FORBES Zoe DELENA Karon DELENA, et al. Surgery versus conservative management of stable thoracolumbar fracture: the PRESTO feasibility RCT. [place] ([place]): [employer]; 2021 Nov. [facility] Technology Assessment, No. 25.62.) Appendix 3, Oswestry Disability Index category descriptors. Available from: Findjewelers.cz  Minimally Clinically Important Difference (MCID) = 12.8%  COGNITION: Overall cognitive status: Within functional limits for tasks  assessed     SENSATION: Light touch: Impaired   MUSCLE LENGTH: Hamstrings: Right 70 deg; Left 82 deg   POSTURE: increased lumbar lordosis  PALPATION: (+) edema R low back just superior to SI region- (+) for pain  LUMBAR ROM:   AROM eval  Flexion *Fingertips to ankle  Extension *WFL  Right lateral flexion *Fingertips to lateral fibular head  Left lateral flexion *Fingertips to lateral knee joint  Right rotation *Full yet painful  Left rotation *Full yet painful   (Blank rows = not tested) (*= painful ROM)  LOWER EXTREMITY ROM:     Active  Right eval Left eval  Hip flexion    Hip extension    Hip abduction    Hip adduction    Hip internal rotation    Hip external rotation    Knee flexion    Knee extension    Ankle dorsiflexion    Ankle plantarflexion    Ankle inversion    Ankle eversion     (Blank rows = not tested)  LOWER EXTREMITY MMT:    MMT Right eval Left eval  Hip flexion 3+ 4  Hip extension    Hip abduction 3+ 4  Hip adduction 3+ 4  Hip internal rotation 3+ 4  Hip external rotation 3+ 4  Knee flexion 4 4  Knee extension 4 4  Ankle dorsiflexion 4+ 5  Ankle plantarflexion    Ankle inversion    Ankle eversion     (Blank rows = not tested)  LUMBAR SPECIAL TESTS:  Straight leg raise test: Negative, Slump test: Positive, and SI Compression/distraction test: Negative  FUNCTIONAL TESTS:  5 times sit to stand: 24.3 sec with min UE support  GAIT: Distance walked: 50 feet Assistive device utilized: None Level of assistance: Complete Independence Comments: Antalgic with decreased step length and poor heel to toe  TREATMENT DATE: 05/27/2024  *** Nustep - interval training to promote LE strength/cardioresp endurance. PT sets up intervention, adjusts intensity throughout and monitors pt for response. Cuing for SPM/speed, pt maintains SPM in 50s-60's. Total Time = 8 min LE only.  Extensive education in [place] regarding options: group sessions,  massage chair options, dumbells/free weights and gym equipment including:  TM- level - 0.8 mph- x 8 min BUE (Close CGA as patient had never used TM- very detailed explanation- demo of use and use of safety clip)   Octane- L3-6 x 8 min - VC for fwd or bwd propulsion.   Brief introduction to gym equipment including some upper and lower body options.- Discussed at length but not actually performed due to time.   PATIENT EDUCATION:  Education details: Pt educated on exercise mechanics/positioning today for improved abdominal contraction/isolation. Person educated: Patient Education method: Explanation Education comprehension: verbalized understanding  HOME EXERCISE PROGRAM: Access Code: [email or web address] URL: [email or web address] Date: 04/30/2024 Prepared by: Reyes London  Exercises - Supine Single Knee to Chest Stretch  - 1 x daily - 3 sets - 30 hold - Supine Lower Trunk Rotation  - 1 x daily - 3 sets - 30 sec hold - Supine Piriformis Stretch with Foot on Ground  - 1 x daily - 3 sets - 30 sec hold - Supine Bridge  - 1 x daily - 3 sets - 10 reps - Seated Lumbar Flexion Stretch  - 1 x daily - 3 sets - 30 hold - Seated Figure 4 Piriformis Stretch  - 1 x daily - 3 sets - 30 hold - Seated Piriformis Stretch  - 1 x daily - 3 sets - 30 hold - Supine Figure 4 Piriformis Stretch  - 1 x daily - 3 sets - 30 hold   ASSESSMENT:  CLINICAL IMPRESSION:  ***    Treatment focused on providing patient some weight loss options to help with his back pain and functional mobility. He is thinking about joining a gym but very unfamiliar with cardiac/mobility devices. Spent majority of session explaining safety and having patient try multiple equipment today. He responded well with no increased in pain. Very challenged on TM and will benefit from review. Pt will continue to benefit from skilled therapy to address remaining deficits in order to improve overall QoL and return to PLOF.   OBJECTIVE  IMPAIRMENTS: Abnormal gait, decreased activity tolerance, decreased balance, decreased coordination, decreased endurance, decreased knowledge of use of DME, decreased mobility, difficulty walking, decreased ROM, decreased strength, decreased safety awareness, impaired perceived functional ability, impaired flexibility, impaired sensation, postural dysfunction, obesity, and pain.   ACTIVITY LIMITATIONS: carrying, lifting, bending, sitting, standing, squatting, stairs, transfers, bed mobility, and dressing  PARTICIPATION LIMITATIONS: meal prep, cleaning, laundry, shopping, community activity, and yard work  PERSONAL FACTORS: 3+ comorbidities: DM, Neuropathy, Crohn's, HTN, SAH are also affecting patient's functional outcome.   REHAB POTENTIAL: Good  CLINICAL DECISION MAKING: Evolving/moderate complexity  EVALUATION COMPLEXITY: Moderate   GOALS: Goals reviewed with patient? Yes  SHORT TERM GOALS: Target date: 06/04/2024  Pt will be independent with HEP in order to improve strength and decrease back pain in order to improve pain-free function at home and work. Baseline: 04/27/2024= no formal HEP in place Goal status: INITIAL     LONG TERM GOALS: Target date: 07/20/2024  Pt will decrease mODI score by at least 13 points in order demonstrate clinically significant reduction in back pain/disability.  Baseline: 04/27/2024 Goal status: INITIAL  2.  Pt will decrease worst back pain as reported on NPRS by at least 2 points in order to demonstrate clinically significant reduction in back pain.  Baseline:  Goal status: INITIAL  3.  Pt will increase strength of by at least 1/2 MMT grade in order  to demonstrate improvement in strength and function. Baseline: 04/27/2024= See above chart Goal status: INITIAL  4.  Patient will report ability to walk > 1/4 mile without reset and < 7/10 low back pain for  improved community distances.  Baseline: 04/27/2024= Pain prevents from walking 1/4 mi Goal  status: INITIAL  PLAN:  PT FREQUENCY: 1-2x/week  PT DURATION: 12 weeks  PLANNED INTERVENTIONS: [id number]- PT Re-evaluation, 97750- Physical Performance Testing, 97110-Therapeutic exercises, 97530- Therapeutic activity, [id number]- Neuromuscular re-education, 97535- Self Care, [id number]- Manual therapy, [id number]- Gait training, [phone]- Orthotic Initial, [phone]- Orthotic/Prosthetic subsequent, [phone]- Canalith repositioning, [phone]- Electrical stimulation (manual), [id number]- Ultrasound, [id number] (1-2 muscles), [id number] (3+ muscles)- Dry Needling, Patient/Family education, Balance training, Stair training, Taping, Joint mobilization, Joint manipulation, Spinal manipulation, Spinal mobilization, Vestibular training, DME instructions, Cryotherapy, and Moist heat.  PLAN FOR NEXT SESSION:  Continue manual therapy for low back pain/ROM Progress HEP  Progress core stabilization activities   Fonda Simpers, PT, DPT Physical Therapist - [place]  [facility]  06/01/24, 9:27 AM

## 2024-06-03 ENCOUNTER — Ambulatory Visit

## 2024-06-08 ENCOUNTER — Ambulatory Visit

## 2024-06-08 DIAGNOSIS — M5441 Lumbago with sciatica, right side: Secondary | ICD-10-CM | POA: Diagnosis not present

## 2024-06-08 DIAGNOSIS — R262 Difficulty in walking, not elsewhere classified: Secondary | ICD-10-CM

## 2024-06-08 DIAGNOSIS — G8929 Other chronic pain: Secondary | ICD-10-CM

## 2024-06-08 DIAGNOSIS — M6281 Muscle weakness (generalized): Secondary | ICD-10-CM

## 2024-06-08 NOTE — Therapy (Addendum)
 OUTPATIENT PHYSICAL THERAPY THORACOLUMBAR TREATMENT   Patient Name: Jeffrey Solomon MRN: [id number] DOB:[birth year], 63 y.o., male Today's Date: 06/08/2024  END OF SESSION:  PT End of Session - 06/08/24 1454     Visit Number 7    Number of Visits 24    Date for Recertification  07/20/24    Progress Note Due on Visit 10    PT Start Time 1354    PT Stop Time 1432    PT Time Calculation (min) 38 min    Activity Tolerance Patient tolerated treatment well;No increased pain    Behavior During Therapy WFL for tasks assessed/performed          Past Medical History:  Diagnosis Date   Diabetes mellitus without complication (HCC)    Hypertension    No past surgical history on file. Patient Active Problem List   Diagnosis Date Noted   Foot callus 11/19/2023   Chronic heart failure with preserved ejection fraction (HFpEF) (HCC) 09/23/2023   History of ischemic stroke 09/16/2023   History of malignant neoplasm of thyroid 09/16/2023   Hypoxia 09/16/2023   Ulcerative colitis (HCC) 02/21/2022   Type 2 diabetes with complication (HCC) 06/22/2021   Thyroid cancer (HCC) 05/25/2021   Vitamin D  deficiency 09/29/2020   Hyperlipidemia 02/26/2020   Iron deficiency anemia 02/26/2020   Obesity 02/26/2020   Hyperparathyroidism 09/28/2019   OSA (obstructive sleep apnea) 09/16/2019   SAH (subarachnoid hemorrhage) (HCC) 12/30/2015   Degeneration of lumbar intervertebral disc 08/06/2011   Hypertension, benign 06/28/2010    PCP: Solomon, Susan, MD  REFERRING PROVIDER: Ziglar, Susan, MD  REFERRING DIAG: [phone] (ICD-10-CM) - Chronic right-sided low back pain with right-sided sciatica   Rationale for Evaluation and Treatment: Rehabilitation  THERAPY DIAG:  Chronic right-sided low back pain with right-sided sciatica  Muscle weakness (generalized)  Difficulty in walking, not elsewhere classified  ONSET DATE: Since age of 47 per patient report  SUBJECTIVE:                                                                                                                                                                                            SUBJECTIVE STATEMENT:   From Today: Pt reports he's doing fair.  He noted having some increased pain in the low back last Monday, and has been trying to take it easy since then.  Pt reports he just moved too quickly or something, but it's still bothering him.    From EVAL:  Patient reports having chronic low back pain for many years yet progressive worse lately. States intermittent R LE sciatica- not consistent with radicular symptoms but does report  constant toothache pain in right side low back. States unsure if anything will help and meds are not helping. Hasn't really tried heat but not a fan of ice. Reports difficulty with everyday life but still able to do all ADL's but with pain. States does not work and goal is to get some relief.   PERTINENT HISTORY:  History taken from medical chart- office visit from Dr. Ziglar on 10/27 History of Present Illness   Jeffrey Solomon is a 63 year old male who presents with persistent back pain and numbness in the right leg.   He has persistent back pain that has not changed over time. He is taking gabapentin  but is unsure of its effectiveness, as he does not notice any difference in his symptoms. He is confused about his medication, noting an instance where he took two pills without realizing they were the same. This was cyclobenzaprine  5mg .    He reports that he has a history of a slipped disc between the fourth and fifth lumbar vertebrae, which he believes contributes to his back pain. He has not engaged in physical therapy recently, although he recalls participating in therapy several years ago for an unspecified issue.   He experiences numbness and pain radiating down his right leg once or twice a month. He describes episodes where he feels like he might fall when stepping, although he has  not fallen yet. He has not noticed significant improvement in the numbness and pain in his right leg.   He recalls a previous attempt to schedule an MRI, which was canceled due to insurance issues. He has not rescheduled it yet. He mentions a past CT scan of his abdomen and pelvis ordered by a gastroenterologist, Dr. Lamar Solomon, which involved drinking a liquid before the scan.  PAIN:  From today: beginning session 9/10, following heating pad and stretches 7/10, end of session 6/10  Are you having pain? Yes: NPRS scale: current 7/10; best= 4/10; worst = 9/10 Pain location: Right side low back  Pain description: Sharp, toothache  Aggravating factors: prolonged walking Relieving factors: nothing  PRECAUTIONS: None  RED FLAGS: None   WEIGHT BEARING RESTRICTIONS: No  FALLS:  Has patient fallen in last 6 months? No  LIVING ENVIRONMENT: Lives with: lives with an adult companion Lives in: House/apartment Stairs: Yes: External: 2.5 steps; on left going up Has following equipment at home: [name] - 2 wheeled  OCCUPATION: Disabled   PLOF: Mother assist with meals. Still driving  PATIENT GOALS: Decrease swelling   NEXT MD VISIT: Jul 21, 2024 with Dr. Onita  OBJECTIVE:  Note: Objective measures were completed at Evaluation unless otherwise noted.  DIAGNOSTIC FINDINGS:  EXAM: Magnetic resonance imaging, spinal canal and contents, lumbar, without contrast material. DATE: 01/03/2022 12:39 PM ACCESSION: [id number] UN DICTATED: 01/03/2022 12:53 PM INTERPRETATION LOCATION: 54.  CLINICAL INDICATION: 63 years old Male with right lumbar radiculopathy x 6 months ; Lumbar radiculopathy, symptoms persist with > 6 wks treatment  - M54.16 - Right lumbar radiculopathy - M54.9 - Back pain, unspecified back location, unspecified back pain laterality, unspecified chronicity    COMPARISON: None  TECHNIQUE: Multiplanar MRI was performed through the lumbar spine without intravenous  contrast.  FINDINGS: Bone marrow signal intensity is normal. The visualized cord is unremarkable and the conus medullaris ends at a normal level.  The T12-L1 disc space is within normal limits.  The L1-L2 disc space is within normal limits.  The L2-L3 disc space demonstrates minimal desiccation of the disc as  well as a diffuse bulging disc. There is degeneration of facet joints resulting in a borderline spinal canal diameter. The neuroforamina are widely patent.  These L3-L4 disc space shows desiccation of the disc and a diffuse disc bulge. The facet joints are degenerated. There is no evidence of spinal canal diameter. There is moderate narrowing of the neuroforamina bilaterally and symmetrically.  The L4-L5 disc space demonstrates desiccation of the disc. There is a small posterior annular fissure. The disc is diffusely bulging is no evidence of spinal canal narrowing. The facet joints are degenerated bilaterally. The neural foramina are moderately to severely stenotic bilaterally and symmetrically.  The L5-S1 disc space demonstrates desiccation of the disc as well as a diffuse disc bulge which is asymmetrical and predominantly to the left. This results in moderate narrowing of the left neuroforamen. The right-sided neuroforamen is normal. The diameter of the spinal canal is normal to. The paraspinal tissues are within normal limits.  For the purposes of this dictation, the lowest well formed intervertebral disc space is assumed to be the L5-S1 level, and there are presumed to be five lumbar-type vertebral bodies. Procedure Note  Thalia Males, MD - 01/03/2022 Formatting of this note might be different from the original. EXAM: Magnetic resonance imaging, spinal canal and contents, lumbar, without contrast material. DATE: 01/03/2022 12:39 PM ACCESSION: [id number] UN DICTATED: 01/03/2022 12:53 PM INTERPRETATION LOCATION: 54.  CLINICAL INDICATION: 63 years old Male with right lumbar  radiculopathy x 6 months ; Lumbar radiculopathy, symptoms persist with > 6 wks treatment  - M54.16 - Right lumbar radiculopathy - M54.9 - Back pain, unspecified back location, unspecified back pain laterality, unspecified chronicity    COMPARISON: None  TECHNIQUE: Multiplanar MRI was performed through the lumbar spine without intravenous contrast.  FINDINGS: Bone marrow signal intensity is normal. The visualized cord is unremarkable and the conus medullaris ends at a normal level.  The T12-L1 disc space is within normal limits.  The L1-L2 disc space is within normal limits.  The L2-L3 disc space demonstrates minimal desiccation of the disc as well as a diffuse bulging disc. There is degeneration of facet joints resulting in a borderline spinal canal diameter. The neuroforamina are widely patent.  These L3-L4 disc space shows desiccation of the disc and a diffuse disc bulge. The facet joints are degenerated. There is no evidence of spinal canal diameter. There is moderate narrowing of the neuroforamina bilaterally and symmetrically.  The L4-L5 disc space demonstrates desiccation of the disc. There is a small posterior annular fissure. The disc is diffusely bulging is no evidence of spinal canal narrowing. The facet joints are degenerated bilaterally. The neural foramina are moderately to severely stenotic bilaterally and symmetrically.  The L5-S1 disc space demonstrates desiccation of the disc as well as a diffuse disc bulge which is asymmetrical and predominantly to the left. This results in moderate narrowing of the left neuroforamen. The right-sided neuroforamen is normal. The diameter of the spinal canal is normal to. The paraspinal tissues are within normal limits.  For the purposes of this dictation, the lowest well formed intervertebral disc space is assumed to be the L5-S1 level, and there are presumed to be five lumbar-type vertebral bodies.  IMPRESSION: Mild degenerative changes no  evidence of significant spinal canal stenosis. Exam End: 01/03/22 12:39   Specimen Collected: 01/03/22 12:53 Last Resulted: 01/03/22 12:57  Received From: [facility]  Result Received: 08/12/23 13:11    PATIENT SURVEYS:  Modified Oswestry:  MODIFIED OSWESTRY DISABILITY SCALE  Date:  04/27/2024 Score  Pain intensity 4 =  Pain medication provides me with little relief from pain.  2. Personal care (washing, dressing, etc.) 2 =  It is painful to take care of myself, and I am slow and careful.  3. Lifting 3 = Pain prevents me from lifting heavy weights, but I can manage light to medium weights if they are conveniently positioned  4. Walking 3 =  Pain prevents me from walking more than  mile.  5. Sitting 2 =  Pain prevents me from sitting more than 1 hour.  6. Standing 3 =  Pain prevents me from standing more than 1/2 hour.  7. Sleeping 0 = Pain does not prevent me from sleeping well.  8. Social Life 3 =  Pain prevents me from going out very often.  9. Traveling 3 = My pain restricts my travel over 1 hour  10. Employment/ Homemaking 2 = I can perform most of my homemaking/job duties, but pain prevents me from performing more physically stressful activities (eg, lifting, vacuuming).  Total 25/50 or 50%   Interpretation of scores: Score Category Description  0-20% Minimal Disability The patient can cope with most living activities. Usually no treatment is indicated apart from advice on lifting, sitting and exercise  21-40% Moderate Disability The patient experiences more pain and difficulty with sitting, lifting and standing. Travel and social life are more difficult and they may be disabled from work. Personal care, sexual activity and sleeping are not grossly affected, and the patient can usually be managed by conservative means  41-60% Severe Disability Pain remains the main problem in this group, but activities of daily living are affected. These patients require a detailed investigation   61-80% Crippled Back pain impinges on all aspects of the patients life. Positive intervention is required  81-100% Bed-bound These patients are either bed-bound or exaggerating their symptoms  Bluford FORBES Zoe DELENA Karon DELENA, et al. Surgery versus conservative management of stable thoracolumbar fracture: the PRESTO feasibility RCT. [place] ([place]): [employer]; 2021 Nov. [facility] [employer] Assessment, No. 25.62.) Appendix 3, Oswestry Disability Index category descriptors. Available from: Findjewelers.cz  Minimally Clinically Important Difference (MCID) = 12.8%  COGNITION: Overall cognitive status: Within functional limits for tasks assessed     SENSATION: Light touch: Impaired   MUSCLE LENGTH: Hamstrings: Right 70 deg; Left 82 deg   POSTURE: increased lumbar lordosis  PALPATION: (+) edema R low back just superior to SI region- (+) for pain  LUMBAR ROM:   AROM eval  Flexion *Fingertips to ankle  Extension *WFL  Right lateral flexion *Fingertips to lateral fibular head  Left lateral flexion *Fingertips to lateral knee joint  Right rotation *Full yet painful  Left rotation *Full yet painful   (Blank rows = not tested) (*= painful ROM)  LOWER EXTREMITY ROM:     Active  Right eval Left eval  Hip flexion    Hip extension    Hip abduction    Hip adduction    Hip internal rotation    Hip external rotation    Knee flexion    Knee extension    Ankle dorsiflexion    Ankle plantarflexion    Ankle inversion    Ankle eversion     (Blank rows = not tested)  LOWER EXTREMITY MMT:    MMT Right eval Left eval  Hip flexion 3+ 4  Hip extension    Hip abduction 3+ 4  Hip adduction 3+ 4  Hip internal rotation 3+ 4  Hip external rotation 3+ 4  Knee flexion 4 4  Knee extension 4 4  Ankle dorsiflexion 4+ 5  Ankle plantarflexion    Ankle inversion    Ankle eversion     (Blank rows = not tested)  LUMBAR SPECIAL TESTS:  Straight  leg raise test: Negative, Slump test: Positive, and SI Compression/distraction test: Negative  FUNCTIONAL TESTS:  5 times sit to stand: 24.3 sec with min UE support  GAIT: Distance walked: 50 feet Assistive device utilized: None Level of assistance: Complete Independence Comments: Antalgic with decreased step length and poor heel to toe  TREATMENT DATE: 06/08/2024  TherAct:  Nustep - interval training to promote LE strength/cardioresp endurance. PT sets up intervention, adjusts intensity throughout and monitors pt for response. Cuing for SPM/speed, pt maintains SPM in 50s-60's. Total Time = 8 min LE only.   Wellzone:  Standing pallof press, 10#, 2x10 facing each direction  Seated lat pull downs, 30#, 2x10 Seated mid row, 30#, 2x10  Seated leg press, 5 plates, 7k87 Seated calf raises, 5 plates, 7k89     PATIENT EDUCATION:  Education details: Pt educated on exercise mechanics/positioning today for improved abdominal contraction/isolation. Person educated: Patient Education method: Explanation Education comprehension: verbalized understanding  HOME EXERCISE PROGRAM: Access Code: [email or web address] URL: [email or web address] Date: 04/30/2024 Prepared by: Reyes London  Exercises - Supine Single Knee to Chest Stretch  - 1 x daily - 3 sets - 30 hold - Supine Lower Trunk Rotation  - 1 x daily - 3 sets - 30 sec hold - Supine Piriformis Stretch with Foot on Ground  - 1 x daily - 3 sets - 30 sec hold - Supine Bridge  - 1 x daily - 3 sets - 10 reps - Seated Lumbar Flexion Stretch  - 1 x daily - 3 sets - 30 hold - Seated Figure 4 Piriformis Stretch  - 1 x daily - 3 sets - 30 hold - Seated Piriformis Stretch  - 1 x daily - 3 sets - 30 hold - Supine Figure 4 Piriformis Stretch  - 1 x daily - 3 sets - 30 hold   ASSESSMENT:  CLINICAL IMPRESSION:  Pt responded well to the exercises and felt much more comfortable with the gym equipment following the exercises in the  [place].  Pt ultimately feels good about transitioning to a gym based program fairly soon and is hoping to lose weight in the process as well.  Pt advised that the primary therapist would be in contact with him at the next visit in order to assess current POC and potentially a discharge.   Pt will continue to benefit from skilled therapy to address remaining deficits in order to improve overall QoL and return to PLOF.       OBJECTIVE IMPAIRMENTS: Abnormal gait, decreased activity tolerance, decreased balance, decreased coordination, decreased endurance, decreased knowledge of use of DME, decreased mobility, difficulty walking, decreased ROM, decreased strength, decreased safety awareness, impaired perceived functional ability, impaired flexibility, impaired sensation, postural dysfunction, obesity, and pain.   ACTIVITY LIMITATIONS: carrying, lifting, bending, sitting, standing, squatting, stairs, transfers, bed mobility, and dressing  PARTICIPATION LIMITATIONS: meal prep, cleaning, laundry, shopping, community activity, and yard work  PERSONAL FACTORS: 3+ comorbidities: DM, Neuropathy, Crohn's, HTN, SAH are also affecting patient's functional outcome.   REHAB POTENTIAL: Good  CLINICAL DECISION MAKING: Evolving/moderate complexity  EVALUATION COMPLEXITY: Moderate   GOALS: Goals reviewed with patient? Yes  SHORT TERM GOALS: Target date: 06/04/2024  Pt will be independent with HEP in order  to improve strength and decrease back pain in order to improve pain-free function at home and work. Baseline: 04/27/2024= no formal HEP in place Goal status: INITIAL   LONG TERM GOALS: Target date: 07/20/2024  Pt will decrease mODI score by at least 13 points in order demonstrate clinically significant reduction in back pain/disability.  Baseline: 04/27/2024 Goal status: INITIAL  2.  Pt will decrease worst back pain as reported on NPRS by at least 2 points in order to demonstrate clinically significant  reduction in back pain.  Baseline:  Goal status: INITIAL  3.  Pt will increase strength of by at least 1/2 MMT grade in order to demonstrate improvement in strength and function. Baseline: 04/27/2024= See above chart Goal status: INITIAL  4.  Patient will report ability to walk > 1/4 mile without reset and < 7/10 low back pain for  improved community distances.  Baseline: 04/27/2024= Pain prevents from walking 1/4 mi Goal status: INITIAL  PLAN:  PT FREQUENCY: 1-2x/week  PT DURATION: 12 weeks  PLANNED INTERVENTIONS: 97164- PT Re-evaluation, 97750- Physical Performance Testing, 97110-Therapeutic exercises, 97530- Therapeutic activity, [id number]- Neuromuscular re-education, 97535- Self Care, [id number]- Manual therapy, [id number]- Gait training, [phone]- Orthotic Initial, [phone]- Orthotic/Prosthetic subsequent, [phone]- Canalith repositioning, [phone]- Electrical stimulation (manual), [id number]- Ultrasound, 79439 (1-2 muscles), 20561 (3+ muscles)- Dry Needling, Patient/Family education, Balance training, Stair training, Taping, Joint mobilization, Joint manipulation, Spinal manipulation, Spinal mobilization, Vestibular training, DME instructions, Cryotherapy, and Moist heat.  PLAN FOR NEXT SESSION:   Progress HEP  Potential for d/c at the next visit?  Pt is aware.   Fonda Simpers, PT, DPT Physical Therapist - [facility]  06/08/2024, 2:54 PM

## 2024-06-10 ENCOUNTER — Ambulatory Visit

## 2024-06-10 ENCOUNTER — Ambulatory Visit: Admitting: Physical Therapy

## 2024-06-10 DIAGNOSIS — M5441 Lumbago with sciatica, right side: Secondary | ICD-10-CM | POA: Diagnosis not present

## 2024-06-10 DIAGNOSIS — M6281 Muscle weakness (generalized): Secondary | ICD-10-CM

## 2024-06-10 DIAGNOSIS — R262 Difficulty in walking, not elsewhere classified: Secondary | ICD-10-CM

## 2024-06-10 DIAGNOSIS — G8929 Other chronic pain: Secondary | ICD-10-CM

## 2024-06-10 NOTE — Therapy (Signed)
 OUTPATIENT PHYSICAL THERAPY THORACOLUMBAR TREATMENT   Patient Name: Jeffrey Solomon MRN: [id number] DOB:[birth year], 63 y.o., male Today's Date: 06/10/2024  END OF SESSION:  PT End of Session - 06/10/24 1109     Visit Number 8    Number of Visits 24    Date for Recertification  07/20/24    Progress Note Due on Visit 10    PT Start Time 1102    Activity Tolerance Patient tolerated treatment well;No increased pain    Behavior During Therapy WFL for tasks assessed/performed           Past Medical History:  Diagnosis Date   Diabetes mellitus without complication (HCC)    Hypertension    History reviewed. No pertinent surgical history. Patient Active Problem List   Diagnosis Date Noted   Foot callus 11/19/2023   Chronic heart failure with preserved ejection fraction (HFpEF) (HCC) 09/23/2023   History of ischemic stroke 09/16/2023   History of malignant neoplasm of thyroid 09/16/2023   Hypoxia 09/16/2023   Ulcerative colitis (HCC) 02/21/2022   Type 2 diabetes with complication (HCC) 06/22/2021   Thyroid cancer (HCC) 05/25/2021   Vitamin D  deficiency 09/29/2020   Hyperlipidemia 02/26/2020   Iron deficiency anemia 02/26/2020   Obesity 02/26/2020   Hyperparathyroidism 09/28/2019   OSA (obstructive sleep apnea) 09/16/2019   SAH (subarachnoid hemorrhage) (HCC) 12/30/2015   Degeneration of lumbar intervertebral disc 08/06/2011   Hypertension, benign 06/28/2010    PCP: Solomon, Susan, MD  REFERRING PROVIDER: Ziglar, Susan, MD  REFERRING DIAG: [phone] (ICD-10-CM) - Chronic right-sided low back pain with right-sided sciatica   Rationale for Evaluation and Treatment: Rehabilitation  THERAPY DIAG:  Chronic right-sided low back pain with right-sided sciatica  Muscle weakness (generalized)  Difficulty in walking, not elsewhere classified  ONSET DATE: Since age of 2 per patient report  SUBJECTIVE:                                                                                                                                                                                            SUBJECTIVE STATEMENT:   From Today: Pt reports pain levels flucuates - now a 8/10- worse with sudden movements. Feeling like a lose my balance with my RLE giving out on me- so far I haven't fallen but I feel like I might.    From EVAL:  Patient reports having chronic low back pain for many years yet progressive worse lately. States intermittent R LE sciatica- not consistent with radicular symptoms but does report constant toothache pain in right side low back. States unsure if anything will help and meds are not helping. Hasn't really  tried heat but not a fan of ice. Reports difficulty with everyday life but still able to do all ADL's but with pain. States does not work and goal is to get some relief.   PERTINENT HISTORY:  History taken from medical chart- office visit from Dr. Ziglar on 10/27 History of Present Illness   Jeffrey Solomon is a 63 year old male who presents with persistent back pain and numbness in the right leg.   He has persistent back pain that has not changed over time. He is taking gabapentin  but is unsure of its effectiveness, as he does not notice any difference in his symptoms. He is confused about his medication, noting an instance where he took two pills without realizing they were the same. This was cyclobenzaprine  5mg .    He reports that he has a history of a slipped disc between the fourth and fifth lumbar vertebrae, which he believes contributes to his back pain. He has not engaged in physical therapy recently, although he recalls participating in therapy several years ago for an unspecified issue.   He experiences numbness and pain radiating down his right leg once or twice a month. He describes episodes where he feels like he might fall when stepping, although he has not fallen yet. He has not noticed significant improvement in the numbness and  pain in his right leg.   He recalls a previous attempt to schedule an MRI, which was canceled due to insurance issues. He has not rescheduled it yet. He mentions a past CT scan of his abdomen and pelvis ordered by a gastroenterologist, Dr. Lamar Solomon, which involved drinking a liquid before the scan.  PAIN:  From today: beginning session 9/10, following heating pad and stretches 7/10, end of session 6/10  Are you having pain? Yes: NPRS scale: current 7/10; best= 4/10; worst = 9/10 Pain location: Right side low back  Pain description: Sharp, toothache  Aggravating factors: prolonged walking Relieving factors: nothing  PRECAUTIONS: None  RED FLAGS: None   WEIGHT BEARING RESTRICTIONS: No  FALLS:  Has patient fallen in last 6 months? No  LIVING ENVIRONMENT: Lives with: lives with an adult companion Lives in: House/apartment Stairs: Yes: External: 2.5 steps; on left going up Has following equipment at home: [name] - 2 wheeled  OCCUPATION: Disabled   PLOF: Mother assist with meals. Still driving  PATIENT GOALS: Decrease swelling   NEXT MD VISIT: Jul 21, 2024 with Dr. Onita  OBJECTIVE:  Note: Objective measures were completed at Evaluation unless otherwise noted.  DIAGNOSTIC FINDINGS:  EXAM: Magnetic resonance imaging, spinal canal and contents, lumbar, without contrast material. DATE: 01/03/2022 12:39 PM ACCESSION: [id number] UN DICTATED: 01/03/2022 12:53 PM INTERPRETATION LOCATION: 54.  CLINICAL INDICATION: 63 years old Male with right lumbar radiculopathy x 6 months ; Lumbar radiculopathy, symptoms persist with > 6 wks treatment  - M54.16 - Right lumbar radiculopathy - M54.9 - Back pain, unspecified back location, unspecified back pain laterality, unspecified chronicity    COMPARISON: None  TECHNIQUE: Multiplanar MRI was performed through the lumbar spine without intravenous contrast.  FINDINGS: Bone marrow signal intensity is normal. The visualized cord is  unremarkable and the conus medullaris ends at a normal level.  The T12-L1 disc space is within normal limits.  The L1-L2 disc space is within normal limits.  The L2-L3 disc space demonstrates minimal desiccation of the disc as well as a diffuse bulging disc. There is degeneration of facet joints resulting in a borderline spinal canal diameter. The neuroforamina  are widely patent.  These L3-L4 disc space shows desiccation of the disc and a diffuse disc bulge. The facet joints are degenerated. There is no evidence of spinal canal diameter. There is moderate narrowing of the neuroforamina bilaterally and symmetrically.  The L4-L5 disc space demonstrates desiccation of the disc. There is a small posterior annular fissure. The disc is diffusely bulging is no evidence of spinal canal narrowing. The facet joints are degenerated bilaterally. The neural foramina are moderately to severely stenotic bilaterally and symmetrically.  The L5-S1 disc space demonstrates desiccation of the disc as well as a diffuse disc bulge which is asymmetrical and predominantly to the left. This results in moderate narrowing of the left neuroforamen. The right-sided neuroforamen is normal. The diameter of the spinal canal is normal to. The paraspinal tissues are within normal limits.  For the purposes of this dictation, the lowest well formed intervertebral disc space is assumed to be the L5-S1 level, and there are presumed to be five lumbar-type vertebral bodies. Procedure Note  Thalia Males, MD - 01/03/2022 Formatting of this note might be different from the original. EXAM: Magnetic resonance imaging, spinal canal and contents, lumbar, without contrast material. DATE: 01/03/2022 12:39 PM ACCESSION: [id number] UN DICTATED: 01/03/2022 12:53 PM INTERPRETATION LOCATION: 54.  CLINICAL INDICATION: 63 years old Male with right lumbar radiculopathy x 6 months ; Lumbar radiculopathy, symptoms persist with > 6 wks treatment   - M54.16 - Right lumbar radiculopathy - M54.9 - Back pain, unspecified back location, unspecified back pain laterality, unspecified chronicity    COMPARISON: None  TECHNIQUE: Multiplanar MRI was performed through the lumbar spine without intravenous contrast.  FINDINGS: Bone marrow signal intensity is normal. The visualized cord is unremarkable and the conus medullaris ends at a normal level.  The T12-L1 disc space is within normal limits.  The L1-L2 disc space is within normal limits.  The L2-L3 disc space demonstrates minimal desiccation of the disc as well as a diffuse bulging disc. There is degeneration of facet joints resulting in a borderline spinal canal diameter. The neuroforamina are widely patent.  These L3-L4 disc space shows desiccation of the disc and a diffuse disc bulge. The facet joints are degenerated. There is no evidence of spinal canal diameter. There is moderate narrowing of the neuroforamina bilaterally and symmetrically.  The L4-L5 disc space demonstrates desiccation of the disc. There is a small posterior annular fissure. The disc is diffusely bulging is no evidence of spinal canal narrowing. The facet joints are degenerated bilaterally. The neural foramina are moderately to severely stenotic bilaterally and symmetrically.  The L5-S1 disc space demonstrates desiccation of the disc as well as a diffuse disc bulge which is asymmetrical and predominantly to the left. This results in moderate narrowing of the left neuroforamen. The right-sided neuroforamen is normal. The diameter of the spinal canal is normal to. The paraspinal tissues are within normal limits.  For the purposes of this dictation, the lowest well formed intervertebral disc space is assumed to be the L5-S1 level, and there are presumed to be five lumbar-type vertebral bodies.  IMPRESSION: Mild degenerative changes no evidence of significant spinal canal stenosis. Exam End: 01/03/22 12:39   Specimen  Collected: 01/03/22 12:53 Last Resulted: 01/03/22 12:57  Received From: [facility]  Result Received: 08/12/23 13:11    PATIENT SURVEYS:  Modified Oswestry:  MODIFIED OSWESTRY DISABILITY SCALE  Date: 04/27/2024 Score  Pain intensity 4 =  Pain medication provides me with little relief from pain.  2. Personal care (  washing, dressing, etc.) 2 =  It is painful to take care of myself, and I am slow and careful.  3. Lifting 3 = Pain prevents me from lifting heavy weights, but I can manage light to medium weights if they are conveniently positioned  4. Walking 3 =  Pain prevents me from walking more than  mile.  5. Sitting 2 =  Pain prevents me from sitting more than 1 hour.  6. Standing 3 =  Pain prevents me from standing more than 1/2 hour.  7. Sleeping 0 = Pain does not prevent me from sleeping well.  8. Social Life 3 =  Pain prevents me from going out very often.  9. Traveling 3 = My pain restricts my travel over 1 hour  10. Employment/ Homemaking 2 = I can perform most of my homemaking/job duties, but pain prevents me from performing more physically stressful activities (eg, lifting, vacuuming).  Total 25/50 or 50%   Interpretation of scores: Score Category Description  0-20% Minimal Disability The patient can cope with most living activities. Usually no treatment is indicated apart from advice on lifting, sitting and exercise  21-40% Moderate Disability The patient experiences more pain and difficulty with sitting, lifting and standing. Travel and social life are more difficult and they may be disabled from work. Personal care, sexual activity and sleeping are not grossly affected, and the patient can usually be managed by conservative means  41-60% Severe Disability Pain remains the main problem in this group, but activities of daily living are affected. These patients require a detailed investigation  61-80% Crippled Back pain impinges on all aspects of the patients life. Positive  intervention is required  81-100% Bed-bound These patients are either bed-bound or exaggerating their symptoms  Bluford FORBES Zoe DELENA Karon DELENA, et al. Surgery versus conservative management of stable thoracolumbar fracture: the PRESTO feasibility RCT. [place] ([place]): [employer]; 2021 Nov. [facility] [employer] Assessment, No. 25.62.) Appendix 3, Oswestry Disability Index category descriptors. Available from: Findjewelers.cz  Minimally Clinically Important Difference (MCID) = 12.8%  COGNITION: Overall cognitive status: Within functional limits for tasks assessed     SENSATION: Light touch: Impaired   MUSCLE LENGTH: Hamstrings: Right 70 deg; Left 82 deg   POSTURE: increased lumbar lordosis  PALPATION: (+) edema R low back just superior to SI region- (+) for pain  LUMBAR ROM:   AROM eval  Flexion *Fingertips to ankle  Extension *WFL  Right lateral flexion *Fingertips to lateral fibular head  Left lateral flexion *Fingertips to lateral knee joint  Right rotation *Full yet painful  Left rotation *Full yet painful   (Blank rows = not tested) (*= painful ROM)  LOWER EXTREMITY ROM:     Active  Right eval Left eval  Hip flexion    Hip extension    Hip abduction    Hip adduction    Hip internal rotation    Hip external rotation    Knee flexion    Knee extension    Ankle dorsiflexion    Ankle plantarflexion    Ankle inversion    Ankle eversion     (Blank rows = not tested)  LOWER EXTREMITY MMT:    MMT Right eval Left eval  Hip flexion 3+ 4  Hip extension    Hip abduction 3+ 4  Hip adduction 3+ 4  Hip internal rotation 3+ 4  Hip external rotation 3+ 4  Knee flexion 4 4  Knee extension 4 4  Ankle dorsiflexion 4+ 5  Ankle plantarflexion    Ankle inversion    Ankle eversion     (Blank rows = not tested)  LUMBAR SPECIAL TESTS:  Straight leg raise test: Negative, Slump test: Positive, and SI Compression/distraction test:  Negative  FUNCTIONAL TESTS:  5 times sit to stand: 24.3 sec with min UE support  GAIT: Distance walked: 50 feet Assistive device utilized: None Level of assistance: Complete Independence Comments: Antalgic with decreased step length and poor heel to toe  TREATMENT DATE: 06/10/2024    Review of lumbar stretching for home: - Lumbar ball rollouts - 10 diagonal left; 10 fwd, 10 diagonal R -Seated hamstring/Sciatic nerve tension stretching/flossing;  2 x 10 reps.  - Wall squats (mini) 2 x 10reps  - calf stretch- hold 45 sec at steps (heels off toward floor)  -Mini lunge squat- 12 reps alt LE -Standing Lumbar flex into ext at wall (VC to keep core tight) 2x 10 reps.        PATIENT EDUCATION:  Education details: Pt educated on exercise mechanics/positioning today for improved abdominal contraction/isolation. Person educated: Patient Education method: Explanation Education comprehension: verbalized understanding  HOME EXERCISE PROGRAM: Access Code: [email or web address] URL: [email or web address] Date: 04/30/2024 Prepared by: Reyes London  Exercises - Supine Single Knee to Chest Stretch  - 1 x daily - 3 sets - 30 hold - Supine Lower Trunk Rotation  - 1 x daily - 3 sets - 30 sec hold - Supine Piriformis Stretch with Foot on Ground  - 1 x daily - 3 sets - 30 sec hold - Supine Bridge  - 1 x daily - 3 sets - 10 reps - Seated Lumbar Flexion Stretch  - 1 x daily - 3 sets - 30 hold - Seated Figure 4 Piriformis Stretch  - 1 x daily - 3 sets - 30 hold - Seated Piriformis Stretch  - 1 x daily - 3 sets - 30 hold - Supine Figure 4 Piriformis Stretch  - 1 x daily - 3 sets - 30 hold   ASSESSMENT:  CLINICAL IMPRESSION: Patient reports able to do the stretches for HEP - feels okay while I do them and feels better after- but one min I feel fine but then the pain comes back like a toothache- doesn't matter what I do. Discussed being as complaint with HEP for desired effect.  He  responded well overall today for some activities that he can perform in home setting vs. Gym and no pain during or immediately after. Will continue with 2 remaining visits for education in some home/gym based activities to assist in managing chronic low back pain. Will refer back to MD if no improvement after 10th visit. Pt will continue to benefit from skilled therapy to address remaining deficits in order to improve overall QoL and return to PLOF.       OBJECTIVE IMPAIRMENTS: Abnormal gait, decreased activity tolerance, decreased balance, decreased coordination, decreased endurance, decreased knowledge of use of DME, decreased mobility, difficulty walking, decreased ROM, decreased strength, decreased safety awareness, impaired perceived functional ability, impaired flexibility, impaired sensation, postural dysfunction, obesity, and pain.   ACTIVITY LIMITATIONS: carrying, lifting, bending, sitting, standing, squatting, stairs, transfers, bed mobility, and dressing  PARTICIPATION LIMITATIONS: meal prep, cleaning, laundry, shopping, community activity, and yard work  PERSONAL FACTORS: 3+ comorbidities: DM, Neuropathy, Crohn's, HTN, SAH are also affecting patient's functional outcome.   REHAB POTENTIAL: Good  CLINICAL DECISION MAKING: Evolving/moderate complexity  EVALUATION COMPLEXITY: Moderate   GOALS: Goals reviewed with patient? Yes  SHORT TERM GOALS:  Target date: 06/04/2024  Pt will be independent with HEP in order to improve strength and decrease back pain in order to improve pain-free function at home and work. Baseline: 04/27/2024= no formal HEP in place Goal status: INITIAL   LONG TERM GOALS: Target date: 07/20/2024  Pt will decrease mODI score by at least 13 points in order demonstrate clinically significant reduction in back pain/disability.  Baseline: 04/27/2024 Goal status: INITIAL  2.  Pt will decrease worst back pain as reported on NPRS by at least 2 points in order to  demonstrate clinically significant reduction in back pain.  Baseline:  Goal status: INITIAL  3.  Pt will increase strength of by at least 1/2 MMT grade in order to demonstrate improvement in strength and function. Baseline: 04/27/2024= See above chart Goal status: INITIAL  4.  Patient will report ability to walk > 1/4 mile without reset and < 7/10 low back pain for  improved community distances.  Baseline: 04/27/2024= Pain prevents from walking 1/4 mi Goal status: INITIAL  PLAN:  PT FREQUENCY: 1-2x/week  PT DURATION: 12 weeks  PLANNED INTERVENTIONS: 97164- PT Re-evaluation, 97750- Physical Performance Testing, 97110-Therapeutic exercises, 97530- Therapeutic activity, [id number]- Neuromuscular re-education, 97535- Self Care, [id number]- Manual therapy, [id number]- Gait training, [phone]- Orthotic Initial, [phone]- Orthotic/Prosthetic subsequent, [phone]- Canalith repositioning, [phone]- Electrical stimulation (manual), [id number]- Ultrasound, 79439 (1-2 muscles), 20561 (3+ muscles)- Dry Needling, Patient/Family education, Balance training, Stair training, Taping, Joint mobilization, Joint manipulation, Spinal manipulation, Spinal mobilization, Vestibular training, DME instructions, Cryotherapy, and Moist heat.  PLAN FOR NEXT SESSION:   Progress HEP  Potential for d/c at the next visit?  Pt is aware.   Fonda Simpers, PT, DPT Physical Therapist - [facility]  06/10/2024, 2:02 PM

## 2024-06-12 ENCOUNTER — Other Ambulatory Visit: Payer: Self-pay | Admitting: Family Medicine

## 2024-06-12 DIAGNOSIS — E118 Type 2 diabetes mellitus with unspecified complications: Secondary | ICD-10-CM

## 2024-06-12 NOTE — Telephone Encounter (Unsigned)
 Copied from CRM #[id number]. Topic: Clinical - Medication Refill >> Jun 12, 2024  9:28 AM Lonell PEDLAR wrote: Medication:  dapagliflozin  propanediol (FARXIGA ) 10 MG TABS tablet  pregabalin (LYRICA) 150 MG capsule   Has the patient contacted their pharmacy? Yes, pt advised to f/u with pcp (Agent: If no, request that the patient contact the pharmacy for the refill. If patient does not wish to contact the pharmacy document the reason why and proceed with request.) (Agent: If yes, when and what did the pharmacy advise?)  This is the patient's preferred pharmacy:  [facility] - [place], [place] - [address] [place] [address] Phone: [phone] Fax: [phone]  Is this the correct pharmacy for this prescription? Yes If no, delete pharmacy and type the correct one.   Has the prescription been filled recently? Yes  Is the patient out of the medication? Yes  Has the patient been seen for an appointment in the last year OR does the patient have an upcoming appointment? Yes  Can we respond through MyChart? No  Agent: Please be advised that Rx refills may take up to 3 business days. We ask that you follow-up with your pharmacy.

## 2024-06-15 ENCOUNTER — Other Ambulatory Visit: Payer: Self-pay | Admitting: Family Medicine

## 2024-06-15 ENCOUNTER — Ambulatory Visit

## 2024-06-15 ENCOUNTER — Ambulatory Visit: Admitting: Physical Therapy

## 2024-06-15 DIAGNOSIS — R262 Difficulty in walking, not elsewhere classified: Secondary | ICD-10-CM

## 2024-06-15 DIAGNOSIS — G8929 Other chronic pain: Secondary | ICD-10-CM

## 2024-06-15 DIAGNOSIS — M6281 Muscle weakness (generalized): Secondary | ICD-10-CM

## 2024-06-15 DIAGNOSIS — M5441 Lumbago with sciatica, right side: Secondary | ICD-10-CM | POA: Diagnosis not present

## 2024-06-15 DIAGNOSIS — E782 Mixed hyperlipidemia: Secondary | ICD-10-CM

## 2024-06-15 MED ORDER — PREGABALIN 150 MG PO CAPS: 150.0000 mg | ORAL_CAPSULE | Freq: Three times a day (TID) | ORAL | 3 refills | Status: AC

## 2024-06-15 NOTE — Therapy (Unsigned)
 " OUTPATIENT PHYSICAL THERAPY THORACOLUMBAR TREATMENT   Patient Name: Jeffrey Solomon MRN: [id number] DOB:07-25-1960, 63 y.o., male Today's Date: 06/16/2024  END OF SESSION:  PT End of Session - 06/15/24 1316     Visit Number 9    Number of Visits 24    Date for Recertification  07/20/24    Progress Note Due on Visit 10    PT Start Time 1315    PT Stop Time 1359    PT Time Calculation (min) 44 min    Equipment Utilized During Treatment Gait belt    Activity Tolerance Patient tolerated treatment well;No increased pain    Behavior During Therapy WFL for tasks assessed/performed           Past Medical History:  Diagnosis Date   Diabetes mellitus without complication (HCC)    Hypertension    History reviewed. No pertinent surgical history. Patient Active Problem List   Diagnosis Date Noted   Foot callus 11/19/2023   Chronic heart failure with preserved ejection fraction (HFpEF) (HCC) 09/23/2023   History of ischemic stroke 09/16/2023   History of malignant neoplasm of thyroid 09/16/2023   Hypoxia 09/16/2023   Ulcerative colitis (HCC) 02/21/2022   Type 2 diabetes with complication (HCC) 06/22/2021   Thyroid cancer (HCC) 05/25/2021   Vitamin D  deficiency 09/29/2020   Hyperlipidemia 02/26/2020   Iron deficiency anemia 02/26/2020   Obesity 02/26/2020   Hyperparathyroidism 09/28/2019   OSA (obstructive sleep apnea) 09/16/2019   SAH (subarachnoid hemorrhage) (HCC) 12/30/2015   Degeneration of lumbar intervertebral disc 08/06/2011   Hypertension, benign 06/28/2010    PCP: Ziglar, Susan, MD  REFERRING PROVIDER: Ziglar, Susan, MD  REFERRING DIAG: [phone] (ICD-10-CM) - Chronic right-sided low back pain with right-sided sciatica   Rationale for Evaluation and Treatment: Rehabilitation  THERAPY DIAG:  Chronic right-sided low back pain with right-sided sciatica  Muscle weakness (generalized)  Difficulty in walking, not elsewhere classified  ONSET DATE: Since  age of 53 per patient report  SUBJECTIVE:                                                                                                                                                                                           SUBJECTIVE STATEMENT:   From Today: Pt reports pain continues to limit his mobility- reports up to 9/10 at times. States feet are feeling better.   From EVAL:  Patient reports having chronic low back pain for many years yet progressive worse lately. States intermittent R LE sciatica- not consistent with radicular symptoms but does report constant toothache pain in right side low back. States unsure if anything will help  and meds are not helping. Hasn't really tried heat but not a fan of ice. Reports difficulty with everyday life but still able to do all ADL's but with pain. States does not work and goal is to get some relief.   PERTINENT HISTORY:  History taken from medical chart- office visit from Dr. Ziglar on 10/27 History of Present Illness   [name] is a 63 year old male who presents with persistent back pain and numbness in the right leg.   He has persistent back pain that has not changed over time. He is taking gabapentin  but is unsure of its effectiveness, as he does not notice any difference in his symptoms. He is confused about his medication, noting an instance where he took two pills without realizing they were the same. This was cyclobenzaprine  5mg .    He reports that he has a history of a slipped disc between the fourth and fifth lumbar vertebrae, which he believes contributes to his back pain. He has not engaged in physical therapy recently, although he recalls participating in therapy several years ago for an unspecified issue.   He experiences numbness and pain radiating down his right leg once or twice a month. He describes episodes where he feels like he might fall when stepping, although he has not fallen yet. He has not noticed significant  improvement in the numbness and pain in his right leg.   He recalls a previous attempt to schedule an MRI, which was canceled due to insurance issues. He has not rescheduled it yet. He mentions a past CT scan of his abdomen and pelvis ordered by a gastroenterologist, Dr. Lamar Ards, which involved drinking a liquid before the scan.  PAIN:  From today: beginning session 9/10, following heating pad and stretches 7/10, end of session 6/10  Are you having pain? Yes: NPRS scale: current 7/10; best= 4/10; worst = 9/10 Pain location: Right side low back  Pain description: Sharp, toothache  Aggravating factors: prolonged walking Relieving factors: nothing  PRECAUTIONS: None  RED FLAGS: None   WEIGHT BEARING RESTRICTIONS: No  FALLS:  Has patient fallen in last 6 months? No  LIVING ENVIRONMENT: Lives with: lives with an adult companion Lives in: House/apartment Stairs: Yes: External: 2.5 steps; on left going up Has following equipment at home: [name] - 2 wheeled  OCCUPATION: Disabled   PLOF: Mother assist with meals. Still driving  PATIENT GOALS: Decrease swelling   NEXT MD VISIT: Jul 21, 2024 with Dr. Onita  OBJECTIVE:  Note: Objective measures were completed at Evaluation unless otherwise noted.  DIAGNOSTIC FINDINGS:  EXAM: Magnetic resonance imaging, spinal canal and contents, lumbar, without contrast material. DATE: 01/03/2022 12:39 PM ACCESSION: [id number] UN DICTATED: 01/03/2022 12:53 PM INTERPRETATION LOCATION: 54.  CLINICAL INDICATION: 63 years old Male with right lumbar radiculopathy x 6 months ; Lumbar radiculopathy, symptoms persist with > 6 wks treatment  - M54.16 - Right lumbar radiculopathy - M54.9 - Back pain, unspecified back location, unspecified back pain laterality, unspecified chronicity    COMPARISON: None  TECHNIQUE: Multiplanar MRI was performed through the lumbar spine without intravenous contrast.  FINDINGS: Bone marrow signal intensity is  normal. The visualized cord is unremarkable and the conus medullaris ends at a normal level.  The T12-L1 disc space is within normal limits.  The L1-L2 disc space is within normal limits.  The L2-L3 disc space demonstrates minimal desiccation of the disc as well as a diffuse bulging disc. There is degeneration of facet joints resulting in  a borderline spinal canal diameter. The neuroforamina are widely patent.  These L3-L4 disc space shows desiccation of the disc and a diffuse disc bulge. The facet joints are degenerated. There is no evidence of spinal canal diameter. There is moderate narrowing of the neuroforamina bilaterally and symmetrically.  The L4-L5 disc space demonstrates desiccation of the disc. There is a small posterior annular fissure. The disc is diffusely bulging is no evidence of spinal canal narrowing. The facet joints are degenerated bilaterally. The neural foramina are moderately to severely stenotic bilaterally and symmetrically.  The L5-S1 disc space demonstrates desiccation of the disc as well as a diffuse disc bulge which is asymmetrical and predominantly to the left. This results in moderate narrowing of the left neuroforamen. The right-sided neuroforamen is normal. The diameter of the spinal canal is normal to. The paraspinal tissues are within normal limits.  For the purposes of this dictation, the lowest well formed intervertebral disc space is assumed to be the L5-S1 level, and there are presumed to be five lumbar-type vertebral bodies. Procedure Note  Thalia Males, MD - 01/03/2022 Formatting of this note might be different from the original. EXAM: Magnetic resonance imaging, spinal canal and contents, lumbar, without contrast material. DATE: 01/03/2022 12:39 PM ACCESSION: [id number] UN DICTATED: 01/03/2022 12:53 PM INTERPRETATION LOCATION: 54.  CLINICAL INDICATION: 63 years old Male with right lumbar radiculopathy x 6 months ; Lumbar radiculopathy, symptoms  persist with > 6 wks treatment  - M54.16 - Right lumbar radiculopathy - M54.9 - Back pain, unspecified back location, unspecified back pain laterality, unspecified chronicity    COMPARISON: None  TECHNIQUE: Multiplanar MRI was performed through the lumbar spine without intravenous contrast.  FINDINGS: Bone marrow signal intensity is normal. The visualized cord is unremarkable and the conus medullaris ends at a normal level.  The T12-L1 disc space is within normal limits.  The L1-L2 disc space is within normal limits.  The L2-L3 disc space demonstrates minimal desiccation of the disc as well as a diffuse bulging disc. There is degeneration of facet joints resulting in a borderline spinal canal diameter. The neuroforamina are widely patent.  These L3-L4 disc space shows desiccation of the disc and a diffuse disc bulge. The facet joints are degenerated. There is no evidence of spinal canal diameter. There is moderate narrowing of the neuroforamina bilaterally and symmetrically.  The L4-L5 disc space demonstrates desiccation of the disc. There is a small posterior annular fissure. The disc is diffusely bulging is no evidence of spinal canal narrowing. The facet joints are degenerated bilaterally. The neural foramina are moderately to severely stenotic bilaterally and symmetrically.  The L5-S1 disc space demonstrates desiccation of the disc as well as a diffuse disc bulge which is asymmetrical and predominantly to the left. This results in moderate narrowing of the left neuroforamen. The right-sided neuroforamen is normal. The diameter of the spinal canal is normal to. The paraspinal tissues are within normal limits.  For the purposes of this dictation, the lowest well formed intervertebral disc space is assumed to be the L5-S1 level, and there are presumed to be five lumbar-type vertebral bodies.  IMPRESSION: Mild degenerative changes no evidence of significant spinal canal stenosis. Exam End:  01/03/22 12:39   Specimen Collected: 01/03/22 12:53 Last Resulted: 01/03/22 12:57  Received From: [facility]  Result Received: 08/12/23 13:11    PATIENT SURVEYS:  Modified Oswestry:  MODIFIED OSWESTRY DISABILITY SCALE  Date: 04/27/2024 Score  Pain intensity 4 =  Pain medication provides me with little  relief from pain.  2. Personal care (washing, dressing, etc.) 2 =  It is painful to take care of myself, and I am slow and careful.  3. Lifting 3 = Pain prevents me from lifting heavy weights, but I can manage light to medium weights if they are conveniently positioned  4. Walking 3 =  Pain prevents me from walking more than  mile.  5. Sitting 2 =  Pain prevents me from sitting more than 1 hour.  6. Standing 3 =  Pain prevents me from standing more than 1/2 hour.  7. Sleeping 0 = Pain does not prevent me from sleeping well.  8. Social Life 3 =  Pain prevents me from going out very often.  9. Traveling 3 = My pain restricts my travel over 1 hour  10. Employment/ Homemaking 2 = I can perform most of my homemaking/job duties, but pain prevents me from performing more physically stressful activities (eg, lifting, vacuuming).  Total 25/50 or 50%   Interpretation of scores: Score Category Description  0-20% Minimal Disability The patient can cope with most living activities. Usually no treatment is indicated apart from advice on lifting, sitting and exercise  21-40% Moderate Disability The patient experiences more pain and difficulty with sitting, lifting and standing. Travel and social life are more difficult and they may be disabled from work. Personal care, sexual activity and sleeping are not grossly affected, and the patient can usually be managed by conservative means  41-60% Severe Disability Pain remains the main problem in this group, but activities of daily living are affected. These patients require a detailed investigation  61-80% Crippled Back pain impinges on all aspects of the  patients life. Positive intervention is required  81-100% Bed-bound These patients are either bed-bound or exaggerating their symptoms  Bluford FORBES Zoe DELENA Karon DELENA, et al. Surgery versus conservative management of stable thoracolumbar fracture: the PRESTO feasibility RCT. [place] ([place]): [employer]; 2021 Nov. [facility] Technology Assessment, No. 25.62.) Appendix 3, Oswestry Disability Index category descriptors. Available from: Findjewelers.cz  Minimally Clinically Important Difference (MCID) = 12.8%  COGNITION: Overall cognitive status: Within functional limits for tasks assessed     SENSATION: Light touch: Impaired   MUSCLE LENGTH: Hamstrings: Right 70 deg; Left 82 deg   POSTURE: increased lumbar lordosis  PALPATION: (+) edema R low back just superior to SI region- (+) for pain  LUMBAR ROM:   AROM eval  Flexion *Fingertips to ankle  Extension *WFL  Right lateral flexion *Fingertips to lateral fibular head  Left lateral flexion *Fingertips to lateral knee joint  Right rotation *Full yet painful  Left rotation *Full yet painful   (Blank rows = not tested) (*= painful ROM)  LOWER EXTREMITY ROM:     Active  Right eval Left eval  Hip flexion    Hip extension    Hip abduction    Hip adduction    Hip internal rotation    Hip external rotation    Knee flexion    Knee extension    Ankle dorsiflexion    Ankle plantarflexion    Ankle inversion    Ankle eversion     (Blank rows = not tested)  LOWER EXTREMITY MMT:    MMT Right eval Left eval  Hip flexion 3+ 4  Hip extension    Hip abduction 3+ 4  Hip adduction 3+ 4  Hip internal rotation 3+ 4  Hip external rotation 3+ 4  Knee flexion 4 4  Knee extension  4 4  Ankle dorsiflexion 4+ 5  Ankle plantarflexion    Ankle inversion    Ankle eversion     (Blank rows = not tested)  LUMBAR SPECIAL TESTS:  Straight leg raise test: Negative, Slump test: Positive, and SI  Compression/distraction test: Negative  FUNCTIONAL TESTS:  5 times sit to stand: 24.3 sec with min UE support  GAIT: Distance walked: 50 feet Assistive device utilized: None Level of assistance: Complete Independence Comments: Antalgic with decreased step length and poor heel to toe  TREATMENT DATE: 06/16/2024   TA:  The patient completed 6 minutes at level(s) 2-6 on the NuStep using both BLE reciprocal movements to promote strength, endurance, and cardiorespiratory fitness. PT increased the resistance level and monitored the patient's response to the intervention throughout. The patient required min VC for technique and SBA level of assistance.    -Sit to stand holding 10 lb ball x 10 with abd contracted  - Wall squats (mini) 2 x 10reps  - calf stretch into calf raises x 15 reps -Mini lunge squat- 12 reps alt LE -Standing hip circles around foam roll with RTB around knees x 15  -Standing hip ext with RTB around ankles x 15 reps  -Standing side steps over 1/2 foam x 12 reps        PATIENT EDUCATION:  Education details: Pt educated on exercise mechanics/positioning today for improved abdominal contraction/isolation. Person educated: Patient Education method: Explanation Education comprehension: verbalized understanding  HOME EXERCISE PROGRAM: Access Code: [email or web address] URL: [email or web address] Date: 04/30/2024 Prepared by: Reyes London  Exercises - Supine Single Knee to Chest Stretch  - 1 x daily - 3 sets - 30 hold - Supine Lower Trunk Rotation  - 1 x daily - 3 sets - 30 sec hold - Supine Piriformis Stretch with Foot on Ground  - 1 x daily - 3 sets - 30 sec hold - Supine Bridge  - 1 x daily - 3 sets - 10 reps - Seated Lumbar Flexion Stretch  - 1 x daily - 3 sets - 30 hold - Seated Figure 4 Piriformis Stretch  - 1 x daily - 3 sets - 30 hold - Seated Piriformis Stretch  - 1 x daily - 3 sets - 30 hold - Supine Figure 4 Piriformis Stretch  - 1 x daily -  3 sets - 30 hold   ASSESSMENT:  CLINICAL IMPRESSION: Patient able to progress to some LE strengthening without causing increased low back pain today. He is performing well overall despite his complaint of low back pain. He has 1 visit remaining to reassess goals and plan for discharge to a home program next visit.     OBJECTIVE IMPAIRMENTS: Abnormal gait, decreased activity tolerance, decreased balance, decreased coordination, decreased endurance, decreased knowledge of use of DME, decreased mobility, difficulty walking, decreased ROM, decreased strength, decreased safety awareness, impaired perceived functional ability, impaired flexibility, impaired sensation, postural dysfunction, obesity, and pain.   ACTIVITY LIMITATIONS: carrying, lifting, bending, sitting, standing, squatting, stairs, transfers, bed mobility, and dressing  PARTICIPATION LIMITATIONS: meal prep, cleaning, laundry, shopping, community activity, and yard work  PERSONAL FACTORS: 3+ comorbidities: DM, Neuropathy, Crohn's, HTN, SAH are also affecting patient's functional outcome.   REHAB POTENTIAL: Good  CLINICAL DECISION MAKING: Evolving/moderate complexity  EVALUATION COMPLEXITY: Moderate   GOALS: Goals reviewed with patient? Yes  SHORT TERM GOALS: Target date: 06/04/2024  Pt will be independent with HEP in order to improve strength and decrease back pain in order to improve pain-free function at home  and work. Baseline: 04/27/2024= no formal HEP in place Goal status: INITIAL   LONG TERM GOALS: Target date: 07/20/2024  Pt will decrease mODI score by at least 13 points in order demonstrate clinically significant reduction in back pain/disability.  Baseline: 04/27/2024 Goal status: INITIAL  2.  Pt will decrease worst back pain as reported on NPRS by at least 2 points in order to demonstrate clinically significant reduction in back pain.  Baseline:  Goal status: INITIAL  3.  Pt will increase strength of by at  least 1/2 MMT grade in order to demonstrate improvement in strength and function. Baseline: 04/27/2024= See above chart Goal status: INITIAL  4.  Patient will report ability to walk > 1/4 mile without reset and < 7/10 low back pain for  improved community distances.  Baseline: 04/27/2024= Pain prevents from walking 1/4 mi Goal status: INITIAL  PLAN:  PT FREQUENCY: 1-2x/week  PT DURATION: 12 weeks  PLANNED INTERVENTIONS: 97164- PT Re-evaluation, 97750- Physical Performance Testing, 97110-Therapeutic exercises, 97530- Therapeutic activity, [id number]- Neuromuscular re-education, 97535- Self Care, [id number]- Manual therapy, [id number]- Gait training, [phone]- Orthotic Initial, [phone]- Orthotic/Prosthetic subsequent, [id number]- Canalith repositioning, [phone]- Electrical stimulation (manual), [id number]- Ultrasound, 79439 (1-2 muscles), 20561 (3+ muscles)- Dry Needling, Patient/Family education, Balance training, Stair training, Taping, Joint mobilization, Joint manipulation, Spinal manipulation, Spinal mobilization, Vestibular training, DME instructions, Cryotherapy, and Moist heat.  PLAN FOR NEXT SESSION:  Plan for discharge next visit   Chyrl London, PT Physical Therapist - [facility]  [facility]  06/16/2024, 9:43 AM  "

## 2024-06-15 NOTE — Telephone Encounter (Signed)
 Farxiga  refill recently sent in. Please advise on lyrica  refill.

## 2024-06-22 ENCOUNTER — Ambulatory Visit: Admitting: Physical Therapy

## 2024-06-24 ENCOUNTER — Ambulatory Visit: Admitting: Physical Therapy

## 2024-06-24 ENCOUNTER — Other Ambulatory Visit: Payer: Self-pay | Admitting: Family Medicine

## 2024-06-24 DIAGNOSIS — E118 Type 2 diabetes mellitus with unspecified complications: Secondary | ICD-10-CM

## 2024-06-29 ENCOUNTER — Ambulatory Visit

## 2024-06-29 ENCOUNTER — Ambulatory Visit: Attending: Family Medicine

## 2024-06-29 DIAGNOSIS — M6281 Muscle weakness (generalized): Secondary | ICD-10-CM | POA: Insufficient documentation

## 2024-06-29 DIAGNOSIS — R262 Difficulty in walking, not elsewhere classified: Secondary | ICD-10-CM | POA: Diagnosis present

## 2024-06-29 DIAGNOSIS — G8929 Other chronic pain: Secondary | ICD-10-CM | POA: Diagnosis present

## 2024-06-29 DIAGNOSIS — M5441 Lumbago with sciatica, right side: Secondary | ICD-10-CM | POA: Insufficient documentation

## 2024-06-29 NOTE — Therapy (Signed)
 " OUTPATIENT PHYSICAL THERAPY THORACOLUMBAR TREATMENT/Physical Therapy Progress Note   Dates of reporting period  04/27/2024   to   06/29/2024/ PT DISCHARGE SUMMARY    Patient Name: ERIVERTO BYRNES MRN: [id number] DOB:03-Jul-1960, 64 y.o., male Today's Date: 06/30/2024  END OF SESSION:  PT End of Session - 06/29/24 1314     Visit Number 10    Number of Visits 24    Date for Recertification  07/20/24    Progress Note Due on Visit 10    PT Start Time 1315    PT Stop Time 1359    PT Time Calculation (min) 44 min    Equipment Utilized During Treatment Gait belt    Activity Tolerance Patient tolerated treatment well;No increased pain    Behavior During Therapy WFL for tasks assessed/performed            Past Medical History:  Diagnosis Date   Diabetes mellitus without complication (HCC)    Hypertension    History reviewed. No pertinent surgical history. Patient Active Problem List   Diagnosis Date Noted   Foot callus 11/19/2023   Chronic heart failure with preserved ejection fraction (HFpEF) (HCC) 09/23/2023   History of ischemic stroke 09/16/2023   History of malignant neoplasm of thyroid 09/16/2023   Hypoxia 09/16/2023   Ulcerative colitis (HCC) 02/21/2022   Type 2 diabetes with complication (HCC) 06/22/2021   Thyroid cancer (HCC) 05/25/2021   Vitamin D  deficiency 09/29/2020   Hyperlipidemia 02/26/2020   Iron deficiency anemia 02/26/2020   Obesity 02/26/2020   Hyperparathyroidism 09/28/2019   OSA (obstructive sleep apnea) 09/16/2019   SAH (subarachnoid hemorrhage) (HCC) 12/30/2015   Degeneration of lumbar intervertebral disc 08/06/2011   Hypertension, benign 06/28/2010    PCP: Ziglar, Susan, MD  REFERRING PROVIDER: Ziglar, Susan, MD  REFERRING DIAG: [phone] (ICD-10-CM) - Chronic right-sided low back pain with right-sided sciatica   Rationale for Evaluation and Treatment: Rehabilitation  THERAPY DIAG:  Chronic right-sided low back pain with right-sided  sciatica  Muscle weakness (generalized)  Difficulty in walking, not elsewhere classified  ONSET DATE: Since age of 10 per patient report  SUBJECTIVE:                                                                                                                                                                                           SUBJECTIVE STATEMENT:   From Today: I survived the New Year I guess. I feel like I do better when I am in here- even down to the smell of this place- makes me feel better. Pt reports pain continues to limit his mobility- reports up to 8/10 today.  My plan is to go to a gym.    From EVAL:  Patient reports having chronic low back pain for many years yet progressive worse lately. States intermittent R LE sciatica- not consistent with radicular symptoms but does report constant toothache pain in right side low back. States unsure if anything will help and meds are not helping. Hasn't really tried heat but not a fan of ice. Reports difficulty with everyday life but still able to do all ADL's but with pain. States does not work and goal is to get some relief.   PERTINENT HISTORY:  History taken from medical chart- office visit from Dr. Ziglar on 10/27 History of Present Illness   FREAD KOTTKE is a 64 year old male who presents with persistent back pain and numbness in the right leg.   He has persistent back pain that has not changed over time. He is taking gabapentin  but is unsure of its effectiveness, as he does not notice any difference in his symptoms. He is confused about his medication, noting an instance where he took two pills without realizing they were the same. This was cyclobenzaprine  5mg .    He reports that he has a history of a slipped disc between the fourth and fifth lumbar vertebrae, which he believes contributes to his back pain. He has not engaged in physical therapy recently, although he recalls participating in therapy several years ago  for an unspecified issue.   He experiences numbness and pain radiating down his right leg once or twice a month. He describes episodes where he feels like he might fall when stepping, although he has not fallen yet. He has not noticed significant improvement in the numbness and pain in his right leg.   He recalls a previous attempt to schedule an MRI, which was canceled due to insurance issues. He has not rescheduled it yet. He mentions a past CT scan of his abdomen and pelvis ordered by a gastroenterologist, Dr. Lamar Ards, which involved drinking a liquid before the scan.  PAIN:  From today: beginning session 9/10, following heating pad and stretches 7/10, end of session 6/10  Are you having pain? Yes: NPRS scale: current 7/10; best= 4/10; worst = 9/10 Pain location: Right side low back  Pain description: Sharp, toothache  Aggravating factors: prolonged walking Relieving factors: nothing  PRECAUTIONS: None  RED FLAGS: None   WEIGHT BEARING RESTRICTIONS: No  FALLS:  Has patient fallen in last 6 months? No  LIVING ENVIRONMENT: Lives with: lives with an adult companion Lives in: House/apartment Stairs: Yes: External: 2.5 steps; on left going up Has following equipment at home: [name] - 2 wheeled  OCCUPATION: Disabled   PLOF: Mother assist with meals. Still driving  PATIENT GOALS: Decrease swelling   NEXT MD VISIT: Jul 21, 2024 with Dr. Onita  OBJECTIVE:  Note: Objective measures were completed at Evaluation unless otherwise noted.  DIAGNOSTIC FINDINGS:  EXAM: Magnetic resonance imaging, spinal canal and contents, lumbar, without contrast material. DATE: 01/03/2022 12:39 PM ACCESSION: [id number] UN DICTATED: 01/03/2022 12:53 PM INTERPRETATION LOCATION: 54.  CLINICAL INDICATION: 64 years old Male with right lumbar radiculopathy x 6 months ; Lumbar radiculopathy, symptoms persist with > 6 wks treatment  - M54.16 - Right lumbar radiculopathy - M54.9 - Back pain,  unspecified back location, unspecified back pain laterality, unspecified chronicity    COMPARISON: None  TECHNIQUE: Multiplanar MRI was performed through the lumbar spine without intravenous contrast.  FINDINGS: Bone marrow signal intensity is normal. The visualized cord  is unremarkable and the conus medullaris ends at a normal level.  The T12-L1 disc space is within normal limits.  The L1-L2 disc space is within normal limits.  The L2-L3 disc space demonstrates minimal desiccation of the disc as well as a diffuse bulging disc. There is degeneration of facet joints resulting in a borderline spinal canal diameter. The neuroforamina are widely patent.  These L3-L4 disc space shows desiccation of the disc and a diffuse disc bulge. The facet joints are degenerated. There is no evidence of spinal canal diameter. There is moderate narrowing of the neuroforamina bilaterally and symmetrically.  The L4-L5 disc space demonstrates desiccation of the disc. There is a small posterior annular fissure. The disc is diffusely bulging is no evidence of spinal canal narrowing. The facet joints are degenerated bilaterally. The neural foramina are moderately to severely stenotic bilaterally and symmetrically.  The L5-S1 disc space demonstrates desiccation of the disc as well as a diffuse disc bulge which is asymmetrical and predominantly to the left. This results in moderate narrowing of the left neuroforamen. The right-sided neuroforamen is normal. The diameter of the spinal canal is normal to. The paraspinal tissues are within normal limits.  For the purposes of this dictation, the lowest well formed intervertebral disc space is assumed to be the L5-S1 level, and there are presumed to be five lumbar-type vertebral bodies. Procedure Note  Thalia Males, MD - 01/03/2022 Formatting of this note might be different from the original. EXAM: Magnetic resonance imaging, spinal canal and contents, lumbar, without  contrast material. DATE: 01/03/2022 12:39 PM ACCESSION: [id number] UN DICTATED: 01/03/2022 12:53 PM INTERPRETATION LOCATION: 54.  CLINICAL INDICATION: 64 years old Male with right lumbar radiculopathy x 6 months ; Lumbar radiculopathy, symptoms persist with > 6 wks treatment  - M54.16 - Right lumbar radiculopathy - M54.9 - Back pain, unspecified back location, unspecified back pain laterality, unspecified chronicity    COMPARISON: None  TECHNIQUE: Multiplanar MRI was performed through the lumbar spine without intravenous contrast.  FINDINGS: Bone marrow signal intensity is normal. The visualized cord is unremarkable and the conus medullaris ends at a normal level.  The T12-L1 disc space is within normal limits.  The L1-L2 disc space is within normal limits.  The L2-L3 disc space demonstrates minimal desiccation of the disc as well as a diffuse bulging disc. There is degeneration of facet joints resulting in a borderline spinal canal diameter. The neuroforamina are widely patent.  These L3-L4 disc space shows desiccation of the disc and a diffuse disc bulge. The facet joints are degenerated. There is no evidence of spinal canal diameter. There is moderate narrowing of the neuroforamina bilaterally and symmetrically.  The L4-L5 disc space demonstrates desiccation of the disc. There is a small posterior annular fissure. The disc is diffusely bulging is no evidence of spinal canal narrowing. The facet joints are degenerated bilaterally. The neural foramina are moderately to severely stenotic bilaterally and symmetrically.  The L5-S1 disc space demonstrates desiccation of the disc as well as a diffuse disc bulge which is asymmetrical and predominantly to the left. This results in moderate narrowing of the left neuroforamen. The right-sided neuroforamen is normal. The diameter of the spinal canal is normal to. The paraspinal tissues are within normal limits.  For the purposes of this dictation,  the lowest well formed intervertebral disc space is assumed to be the L5-S1 level, and there are presumed to be five lumbar-type vertebral bodies.  IMPRESSION: Mild degenerative changes no evidence of significant spinal canal  stenosis. Exam End: 01/03/22 12:39   Specimen Collected: 01/03/22 12:53 Last Resulted: 01/03/22 12:57  Received From: [facility]  Result Received: 08/12/23 13:11    PATIENT SURVEYS:  Modified Oswestry:  MODIFIED OSWESTRY DISABILITY SCALE  Date: 04/27/2024 Score  Pain intensity 4 =  Pain medication provides me with little relief from pain.  2. Personal care (washing, dressing, etc.) 2 =  It is painful to take care of myself, and I am slow and careful.  3. Lifting 3 = Pain prevents me from lifting heavy weights, but I can manage light to medium weights if they are conveniently positioned  4. Walking 3 =  Pain prevents me from walking more than  mile.  5. Sitting 2 =  Pain prevents me from sitting more than 1 hour.  6. Standing 3 =  Pain prevents me from standing more than 1/2 hour.  7. Sleeping 0 = Pain does not prevent me from sleeping well.  8. Social Life 3 =  Pain prevents me from going out very often.  9. Traveling 3 = My pain restricts my travel over 1 hour  10. Employment/ Homemaking 2 = I can perform most of my homemaking/job duties, but pain prevents me from performing more physically stressful activities (eg, lifting, vacuuming).  Total 25/50 or 50%   Interpretation of scores: Score Category Description  0-20% Minimal Disability The patient can cope with most living activities. Usually no treatment is indicated apart from advice on lifting, sitting and exercise  21-40% Moderate Disability The patient experiences more pain and difficulty with sitting, lifting and standing. Travel and social life are more difficult and they may be disabled from work. Personal care, sexual activity and sleeping are not grossly affected, and the patient can usually be  managed by conservative means  41-60% Severe Disability Pain remains the main problem in this group, but activities of daily living are affected. These patients require a detailed investigation  61-80% Crippled Back pain impinges on all aspects of the patients life. Positive intervention is required  81-100% Bed-bound These patients are either bed-bound or exaggerating their symptoms  Bluford FORBES Zoe DELENA Karon DELENA, et al. Surgery versus conservative management of stable thoracolumbar fracture: the PRESTO feasibility RCT. [place] ([place]): [employer]; 2021 Nov. [facility] Technology Assessment, No. 25.62.) Appendix 3, Oswestry Disability Index category descriptors. Available from: Findjewelers.cz  Minimally Clinically Important Difference (MCID) = 12.8%  COGNITION: Overall cognitive status: Within functional limits for tasks assessed     SENSATION: Light touch: Impaired   MUSCLE LENGTH: Hamstrings: Right 70 deg; Left 82 deg   POSTURE: increased lumbar lordosis  PALPATION: (+) edema R low back just superior to SI region- (+) for pain  LUMBAR ROM:   AROM eval  Flexion *Fingertips to ankle  Extension *WFL  Right lateral flexion *Fingertips to lateral fibular head  Left lateral flexion *Fingertips to lateral knee joint  Right rotation *Full yet painful  Left rotation *Full yet painful   (Blank rows = not tested) (*= painful ROM)  LOWER EXTREMITY ROM:     Active  Right eval Left eval  Hip flexion    Hip extension    Hip abduction    Hip adduction    Hip internal rotation    Hip external rotation    Knee flexion    Knee extension    Ankle dorsiflexion    Ankle plantarflexion    Ankle inversion    Ankle eversion     (Blank rows =  not tested)  LOWER EXTREMITY MMT:    MMT Right eval Left eval R 06/29/2024  Hip flexion 3+ 4 4+  Hip extension     Hip abduction 3+ 4 4+  Hip adduction 3+ 4 4+  Hip internal rotation 3+ 4 4+  Hip  external rotation 3+ 4 4+  Knee flexion 4 4 4+  Knee extension 4 4 4+  Ankle dorsiflexion 4+ 5 4+  Ankle plantarflexion     Ankle inversion     Ankle eversion      (Blank rows = not tested)  LUMBAR SPECIAL TESTS:  Straight leg raise test: Negative, Slump test: Positive, and SI Compression/distraction test: Negative  FUNCTIONAL TESTS:  5 times sit to stand: 24.3 sec with min UE support  GAIT: Distance walked: 50 feet Assistive device utilized: None Level of assistance: Complete Independence Comments: Antalgic with decreased step length and poor heel to toe  TREATMENT DATE: 06/29/2024   Physical therapy treatment session today consisted of completing assessment of goals and administration of testing as demonstrated and documented in flow sheet, treatment, and goals section of this note. Addition treatments may be found below.   MMT (see above chart)   Gait:  - Walking in clinic approx 10 min (measured 0.5 mi)   Self care: -Verbal review of importance of Low back stretching, daily walking, and some specific gym activities- knee ext, hamstrings, squats, lunges -Sit to stand x 10 with abd contracted  - Wall squats (mini) 2 x 10reps  - calf raises x 15 reps -Mini lunge squat- 12 reps alt LE        PATIENT EDUCATION:  Education details: Pt educated on exercise mechanics/positioning today for improved abdominal contraction/isolation. Person educated: Patient Education method: Explanation Education comprehension: verbalized understanding  HOME EXERCISE PROGRAM: Access Code: [email or web address] URL: [email or web address] Date: 04/30/2024 Prepared by: Reyes London  Exercises - Supine Single Knee to Chest Stretch  - 1 x daily - 3 sets - 30 hold - Supine Lower Trunk Rotation  - 1 x daily - 3 sets - 30 sec hold - Supine Piriformis Stretch with Foot on Ground  - 1 x daily - 3 sets - 30 sec hold - Supine Bridge  - 1 x daily - 3 sets - 10 reps - Seated Lumbar  Flexion Stretch  - 1 x daily - 3 sets - 30 hold - Seated Figure 4 Piriformis Stretch  - 1 x daily - 3 sets - 30 hold - Seated Piriformis Stretch  - 1 x daily - 3 sets - 30 hold - Supine Figure 4 Piriformis Stretch  - 1 x daily - 3 sets - 30 hold   ASSESSMENT:  CLINICAL IMPRESSION: To date- patient continues to endorse low back pain- still up to 9/10 at worst but reports grateful to learn some activities that he can do to assist with pain and keep him mobile. He did progress some with LE strength and knowledge of how to remain flexible and strengthen LE without increasing his low back pain. He has met majority of goals and appropriate for discharge today and plans to continue on his own.    OBJECTIVE IMPAIRMENTS: Abnormal gait, decreased activity tolerance, decreased balance, decreased coordination, decreased endurance, decreased knowledge of use of DME, decreased mobility, difficulty walking, decreased ROM, decreased strength, decreased safety awareness, impaired perceived functional ability, impaired flexibility, impaired sensation, postural dysfunction, obesity, and pain.   ACTIVITY LIMITATIONS: carrying, lifting, bending, sitting, standing, squatting, stairs, transfers, bed mobility, and dressing  PARTICIPATION LIMITATIONS: meal prep, cleaning, laundry, shopping, community activity, and yard work  PERSONAL FACTORS: 3+ comorbidities: DM, Neuropathy, Crohn's, HTN, SAH are also affecting patient's functional outcome.   REHAB POTENTIAL: Good  CLINICAL DECISION MAKING: Evolving/moderate complexity  EVALUATION COMPLEXITY: Moderate   GOALS: Goals reviewed with patient? Yes  SHORT TERM GOALS: Target date: 06/04/2024  Pt will be independent with HEP in order to improve strength and decrease back pain in order to improve pain-free function at home and work. Baseline: 04/27/2024= no formal HEP in place; 06/29/2024= Patient able to verbalize some low back stretches, walking, gym activities and  plans to join gym Goal status: MET   LONG TERM GOALS: Target date: 07/20/2024  Pt will decrease mODI score by at least 13 points in order demonstrate clinically significant reduction in back pain/disability.  Baseline: 04/27/2024= 50%; 06/29/2024= 40%  Goal status: IMPROVED  2.  Pt will decrease worst back pain as reported on NPRS by at least 2 points in order to demonstrate clinically significant reduction in back pain.  Baseline: 9/10 at worst; still 9/10 at worst Goal status: GOAL NOT MET  3.  Pt will increase strength of by at least 1/2 MMT grade in order to demonstrate improvement in strength and function. Baseline: 04/27/2024= See above chart; 06/29/2024= See chart  Goal status: GOAL MET  4.  Patient will report ability to walk > 1/4 mile without reset and < 7/10 low back pain for  improved community distances.  Baseline: 04/27/2024= Pain prevents from walking 1/4 mi; 06/29/2024= Patient ambulated 0.5 mi in clinic today with 6/10 LBP.  Goal status: MET  PLAN:  PT FREQUENCY: 1-2x/week  PT DURATION: 12 weeks  PLANNED INTERVENTIONS: 97164- PT Re-evaluation, 97750- Physical Performance Testing, 97110-Therapeutic exercises, 97530- Therapeutic activity, [id number]- Neuromuscular re-education, 97535- Self Care, 02859- Manual therapy, [id number]- Gait training, [phone]- Orthotic Initial, [phone]- Orthotic/Prosthetic subsequent, [phone]- Canalith repositioning, [phone]- Electrical stimulation (manual), [id number]- Ultrasound, [phone] (1-2 muscles), 20561 (3+ muscles)- Dry Needling, Patient/Family education, Balance training, Stair training, Taping, Joint mobilization, Joint manipulation, Spinal manipulation, Spinal mobilization, Vestibular training, DME instructions, Cryotherapy, and Moist heat.  PLAN FOR NEXT SESSION:  Discharge today   Chyrl London, PT Physical Therapist - [facility]  06/30/2024, 9:10 AM  "

## 2024-07-01 ENCOUNTER — Ambulatory Visit: Admitting: Physical Therapy

## 2024-07-07 ENCOUNTER — Ambulatory Visit

## 2024-07-09 ENCOUNTER — Ambulatory Visit

## 2024-07-13 ENCOUNTER — Ambulatory Visit

## 2024-07-15 ENCOUNTER — Ambulatory Visit: Admitting: Physical Therapy

## 2024-07-20 ENCOUNTER — Ambulatory Visit

## 2024-07-21 ENCOUNTER — Ambulatory Visit: Admitting: Family Medicine

## 2024-07-22 ENCOUNTER — Ambulatory Visit: Admitting: Physical Therapy

## 2024-07-27 ENCOUNTER — Ambulatory Visit

## 2024-07-27 ENCOUNTER — Other Ambulatory Visit: Payer: Self-pay | Admitting: Family Medicine

## 2024-07-27 DIAGNOSIS — M5136 Other intervertebral disc degeneration, lumbar region with discogenic back pain only: Secondary | ICD-10-CM

## 2024-07-29 ENCOUNTER — Encounter: Payer: Self-pay | Admitting: Family Medicine

## 2024-07-29 ENCOUNTER — Ambulatory Visit (INDEPENDENT_AMBULATORY_CARE_PROVIDER_SITE_OTHER): Payer: Self-pay | Admitting: Family Medicine

## 2024-07-29 VITALS — BP 120/80 | HR 56 | Ht 70.0 in | Wt 259.0 lb

## 2024-07-29 DIAGNOSIS — M51362 Other intervertebral disc degeneration, lumbar region with discogenic back pain and lower extremity pain: Secondary | ICD-10-CM

## 2024-07-29 NOTE — Progress Notes (Signed)
" ° °  Established Patient Office Visit  Subjective   Patient ID: Jeffrey Solomon, male    DOB: 14-Dec-1960  Age: 64 y.o. MRN: [id number]  Chief Complaint  Patient presents with   Follow-up    HPI Discussed the use of AI scribe software for clinical note transcription with the patient, who gave verbal consent to proceed.  History of Present Illness   Jeffrey Solomon is a 64 year old male who presents with right hip and back pain.  He has been experiencing back pain that has recently shifted to his right hip. The pain alternates between his back and hip, described as 'jumping back and forth'. He has undergone physical therapy, which provided some relief, but he is seeking further evaluation, including an MRI, which was previously denied. He also experiences pain radiating down his right leg, sometimes causing a sensation of 'falling on the floor'.  He is currently taking gabapentin , although he is unsure of the exact dosage and its effectiveness. He recalls a previous conversation about the medication where he was uncertain if it was helping.       Objective:     BP 120/80   Pulse (!) 56   Ht 5' 10 (1.778 m)   Wt 259 lb (117.5 kg)   SpO2 94%   BMI 37.16 kg/m    Physical Exam Vitals and nursing note reviewed.  Constitutional:      Appearance: Normal appearance.  HENT:     Head: Normocephalic and atraumatic.  Eyes:     Conjunctiva/sclera: Conjunctivae normal.  Cardiovascular:     Rate and Rhythm: Normal rate and regular rhythm.  Pulmonary:     Effort: Pulmonary effort is normal.     Breath sounds: Normal breath sounds.  Musculoskeletal:     Right lower leg: No edema.     Left lower leg: No edema.  Skin:    General: Skin is warm and dry.  Neurological:     Mental Status: He is alert and oriented to person, place, and time.  Psychiatric:        Mood and Affect: Mood normal.        Behavior: Behavior normal.        Thought Content: Thought content normal.         Judgment: Judgment normal.          No results found for any visits on 07/29/24.    The ASCVD Risk score (Arnett DK, et al., 2019) failed to calculate for the following reasons:   Risk score cannot be calculated because patient has a medical history suggesting prior/existing ASCVD   * - Cholesterol units were assumed    Assessment & Plan:  Degeneration of intervertebral disc of lumbar region with discogenic back pain and lower extremity pain Assessment & Plan: Chronic lumbar radiculopathy with pain radiating from his back down his right hip and leg.  Pain is intermittent and sometimes severe causing a sensation of falling.  Previous physical therapy provided some relief gabapentin  has not been very effective.  Ordered MRI of the lumbar spine at [facility].  Please follow-up after you get your MRI.  Orders: -     MR LUMBAR SPINE WO CONTRAST; Future     Return Appointemtn after MRI results.    Lagina Reader K Jazmen Lindenbaum, MD "

## 2024-07-29 NOTE — Assessment & Plan Note (Signed)
 Chronic lumbar radiculopathy with pain radiating from his back down his right hip and leg.  Pain is intermittent and sometimes severe causing a sensation of falling.  Previous physical therapy provided some relief gabapentin  has not been very effective.  Ordered MRI of the lumbar spine at [facility].  Please follow-up after you get your MRI.

## 2024-07-30 ENCOUNTER — Ambulatory Visit

## 2024-08-04 ENCOUNTER — Ambulatory Visit

## 2024-08-06 ENCOUNTER — Ambulatory Visit

## 2024-08-11 ENCOUNTER — Ambulatory Visit

## 2024-08-13 ENCOUNTER — Ambulatory Visit

## 2024-09-08 ENCOUNTER — Ambulatory Visit: Admitting: Sleep Medicine
